# Patient Record
Sex: Female | Born: 1960 | Race: White | Hispanic: No | Marital: Married | Smoking: Never smoker
Health system: Southern US, Community
[De-identification: ages and names within clinical notes are randomized; demographics above are authoritative.]

## PROBLEM LIST (undated history)

## (undated) ENCOUNTER — Ambulatory Visit: Admission: EM

## (undated) DIAGNOSIS — N809 Endometriosis, unspecified: Secondary | ICD-10-CM

## (undated) DIAGNOSIS — N946 Dysmenorrhea, unspecified: Secondary | ICD-10-CM

## (undated) DIAGNOSIS — IMO0002 Reserved for concepts with insufficient information to code with codable children: Secondary | ICD-10-CM

## (undated) DIAGNOSIS — R519 Headache, unspecified: Secondary | ICD-10-CM

## (undated) DIAGNOSIS — R51 Headache: Secondary | ICD-10-CM

## (undated) DIAGNOSIS — M542 Cervicalgia: Secondary | ICD-10-CM

## (undated) DIAGNOSIS — K219 Gastro-esophageal reflux disease without esophagitis: Secondary | ICD-10-CM

## (undated) HISTORY — DX: Endometriosis, unspecified: N80.9

## (undated) HISTORY — DX: Gastro-esophageal reflux disease without esophagitis: K21.9

## (undated) HISTORY — DX: Headache: R51

## (undated) HISTORY — DX: Headache, unspecified: R51.9

## (undated) HISTORY — DX: Cervicalgia: M54.2

## (undated) HISTORY — PX: ABDOMINAL HYSTERECTOMY: SHX81

## (undated) HISTORY — DX: Dysmenorrhea, unspecified: N94.6

## (undated) HISTORY — DX: Reserved for concepts with insufficient information to code with codable children: IMO0002

---

## 2005-10-07 ENCOUNTER — Ambulatory Visit: Payer: Self-pay | Admitting: Internal Medicine

## 2005-10-17 ENCOUNTER — Ambulatory Visit: Payer: Self-pay | Admitting: Internal Medicine

## 2007-10-07 HISTORY — PX: COLONOSCOPY W/ POLYPECTOMY: SHX1380

## 2011-11-19 ENCOUNTER — Ambulatory Visit: Payer: Self-pay | Admitting: Internal Medicine

## 2012-05-26 DIAGNOSIS — IMO0002 Reserved for concepts with insufficient information to code with codable children: Secondary | ICD-10-CM | POA: Insufficient documentation

## 2012-05-26 DIAGNOSIS — R3915 Urgency of urination: Secondary | ICD-10-CM | POA: Insufficient documentation

## 2012-05-26 DIAGNOSIS — R350 Frequency of micturition: Secondary | ICD-10-CM | POA: Insufficient documentation

## 2012-09-01 DIAGNOSIS — N9489 Other specified conditions associated with female genital organs and menstrual cycle: Secondary | ICD-10-CM | POA: Insufficient documentation

## 2012-09-01 DIAGNOSIS — N3010 Interstitial cystitis (chronic) without hematuria: Secondary | ICD-10-CM | POA: Insufficient documentation

## 2012-09-01 DIAGNOSIS — M6289 Other specified disorders of muscle: Secondary | ICD-10-CM | POA: Insufficient documentation

## 2012-09-01 DIAGNOSIS — N94819 Vulvodynia, unspecified: Secondary | ICD-10-CM | POA: Insufficient documentation

## 2012-11-22 ENCOUNTER — Ambulatory Visit: Payer: Self-pay | Admitting: Unknown Physician Specialty

## 2013-03-08 ENCOUNTER — Ambulatory Visit: Payer: Self-pay | Admitting: Internal Medicine

## 2014-03-21 ENCOUNTER — Other Ambulatory Visit: Payer: Self-pay

## 2014-03-21 DIAGNOSIS — Z1231 Encounter for screening mammogram for malignant neoplasm of breast: Secondary | ICD-10-CM

## 2014-04-10 ENCOUNTER — Ambulatory Visit: Payer: Self-pay

## 2014-04-14 ENCOUNTER — Other Ambulatory Visit: Payer: Self-pay

## 2014-04-14 ENCOUNTER — Encounter (INDEPENDENT_AMBULATORY_CARE_PROVIDER_SITE_OTHER): Payer: Self-pay

## 2014-04-14 ENCOUNTER — Ambulatory Visit
Admission: RE | Admit: 2014-04-14 | Discharge: 2014-04-14 | Disposition: A | Discharge status: Home / Self Care | Payer: 59 | Source: Ambulatory Visit

## 2014-04-14 ENCOUNTER — Ambulatory Visit: Payer: Self-pay

## 2014-04-14 DIAGNOSIS — Z1231 Encounter for screening mammogram for malignant neoplasm of breast: Secondary | ICD-10-CM

## 2015-01-08 ENCOUNTER — Emergency Department
Admit: 2015-01-08 | Disposition: A | Discharge status: Home / Self Care | Payer: Self-pay | Admitting: Emergency Medicine

## 2015-01-08 LAB — CBC
HCT: 41.9 % (ref 35.0–47.0)
HGB: 13.9 g/dL (ref 12.0–16.0)
MCH: 32.6 pg (ref 26.0–34.0)
MCHC: 33.2 g/dL (ref 32.0–36.0)
MCV: 98 fL (ref 80–100)
PLATELETS: 230 10*3/uL (ref 150–440)
RBC: 4.26 10*6/uL (ref 3.80–5.20)
RDW: 12.5 % (ref 11.5–14.5)
WBC: 4.6 10*3/uL (ref 3.6–11.0)

## 2015-01-08 LAB — BASIC METABOLIC PANEL
Anion Gap: 7 (ref 7–16)
BUN: 12 mg/dL
CHLORIDE: 101 mmol/L
Calcium, Total: 8.8 mg/dL — ABNORMAL LOW
Co2: 29 mmol/L
Creatinine: 0.72 mg/dL
Glucose: 97 mg/dL
POTASSIUM: 3.1 mmol/L — AB
Sodium: 137 mmol/L

## 2015-01-08 LAB — TROPONIN I: Troponin-I: <0.03 ng/mL

## 2015-03-20 ENCOUNTER — Telehealth: Payer: Self-pay | Admitting: Obstetrics and Gynecology

## 2015-03-20 ENCOUNTER — Other Ambulatory Visit: Payer: Self-pay | Admitting: Obstetrics and Gynecology

## 2015-03-20 DIAGNOSIS — R922 Inconclusive mammogram: Secondary | ICD-10-CM

## 2015-03-20 NOTE — Telephone Encounter (Signed)
Forward from [employer]

## 2015-03-20 NOTE — Telephone Encounter (Signed)
Done, thanks tina

## 2015-03-20 NOTE — Telephone Encounter (Signed)
Order put in and added note to do 3D films

## 2015-03-20 NOTE — Telephone Encounter (Signed)
Pt called and was having her mammos done in [place] at the [facility] but now she would like to go to [facility] since they do the 3D mommo, she was told that you need to put an order in for the 3d mammo so she could have it done, let m eknow when you have done so and I can call the pt and let her know she can call [facility] and set her mammo up. tina .

## 2015-03-29 ENCOUNTER — Other Ambulatory Visit: Payer: Self-pay | Admitting: Obstetrics and Gynecology

## 2015-05-02 ENCOUNTER — Other Ambulatory Visit: Payer: Self-pay | Admitting: Obstetrics and Gynecology

## 2015-05-29 ENCOUNTER — Other Ambulatory Visit: Payer: Self-pay | Admitting: Obstetrics and Gynecology

## 2015-06-12 ENCOUNTER — Encounter: Payer: Self-pay | Admitting: *Deleted

## 2015-06-13 ENCOUNTER — Ambulatory Visit (INDEPENDENT_AMBULATORY_CARE_PROVIDER_SITE_OTHER): Payer: 59 | Admitting: Obstetrics and Gynecology

## 2015-06-13 ENCOUNTER — Encounter: Payer: Self-pay | Admitting: Obstetrics and Gynecology

## 2015-06-13 VITALS — BP 110/69 | HR 74 | Ht 66.0 in | Wt 137.4 lb

## 2015-06-13 DIAGNOSIS — Z79890 Hormone replacement therapy (postmenopausal): Secondary | ICD-10-CM

## 2015-06-13 MED ORDER — ESTRADIOL MICRONIZED POWD: 1.0000 | Freq: Every day | Status: DC

## 2015-06-19 ENCOUNTER — Ambulatory Visit
Admission: RE | Admit: 2015-06-19 | Discharge: 2015-06-19 | Disposition: A | Discharge status: Home / Self Care | Payer: 59 | Source: Ambulatory Visit | Attending: Obstetrics and Gynecology | Admitting: Obstetrics and Gynecology

## 2015-06-19 ENCOUNTER — Other Ambulatory Visit: Payer: Self-pay | Admitting: Obstetrics and Gynecology

## 2015-06-19 ENCOUNTER — Telehealth: Payer: Self-pay | Admitting: *Deleted

## 2015-06-19 DIAGNOSIS — R922 Inconclusive mammogram: Secondary | ICD-10-CM

## 2015-06-19 DIAGNOSIS — Z1231 Encounter for screening mammogram for malignant neoplasm of breast: Secondary | ICD-10-CM | POA: Insufficient documentation

## 2015-06-19 NOTE — Telephone Encounter (Signed)
-----   Message from Evonnie Pat, [place] sent at 06/19/2015  9:55 AM EDT ----- Please let her know her mmg was normal

## 2015-06-19 NOTE — Telephone Encounter (Signed)
Left detailed message about patients mammo

## 2015-08-07 ENCOUNTER — Encounter: Payer: Self-pay | Admitting: Obstetrics and Gynecology

## 2015-08-23 ENCOUNTER — Other Ambulatory Visit: Payer: Self-pay | Admitting: Obstetrics and Gynecology

## 2015-08-23 ENCOUNTER — Encounter: Payer: Self-pay | Admitting: Obstetrics and Gynecology

## 2015-08-23 ENCOUNTER — Ambulatory Visit (INDEPENDENT_AMBULATORY_CARE_PROVIDER_SITE_OTHER): Payer: 59 | Admitting: Obstetrics and Gynecology

## 2015-08-23 VITALS — BP 110/70 | HR 69 | Wt 143.5 lb

## 2015-08-23 DIAGNOSIS — Z01419 Encounter for gynecological examination (general) (routine) without abnormal findings: Secondary | ICD-10-CM

## 2015-08-23 MED ORDER — ALPRAZOLAM 0.25 MG PO TABS: 0.2500 mg | ORAL_TABLET | Freq: Every evening | ORAL | Status: DC | PRN

## 2015-08-23 MED ORDER — EFFEXOR XR 75 MG PO CP24: 75.0000 mg | ORAL_CAPSULE | Freq: Every day | ORAL | Status: DC

## 2015-08-23 MED ORDER — ESTRADIOL MICRONIZED POWD: 1.0000 | Freq: Every day | Status: DC

## 2015-08-23 NOTE — Progress Notes (Signed)
Subjective:   Donna Collins is a 54 y.o. No obstetric history on file. Caucasian female here for a routine well-woman exam.  No LMP recorded. Patient is postmenopausal.    Current complaints: vaginal dryness when backs off estrogen cream PCP: me       Does need labs  Social History: Sexual: heterosexual Marital Status: married Living situation: with spouse Occupation: Freight forwarder Tobacco/alcohol: no tobacco use Illicit drugs: no history of illicit drug use  The following portions of the patient's history were reviewed and updated as appropriate: allergies, current medications, past family history, past medical history, past social history, past surgical history and problem list.  Past Medical History Past Medical History  Diagnosis Date  . Endometriosis   . Painful menstrual periods   . GERD (gastroesophageal reflux disease)   . Headache   . Neck pain   . Painful intercourse     Past Surgical History Past Surgical History  Procedure Laterality Date  . Colonoscopy w/ polypectomy  2009    Gynecologic History No obstetric history on file.  No LMP recorded. Patient is postmenopausal. Contraception: post menopausal status Last Pap: 2014. Results were: normal Last mammogram: 2015. Results were: normal  Obstetric History OB History  No data available    Current Medications Current Outpatient Prescriptions on File Prior to Visit  Medication Sig Dispense Refill  . progesterone (PROMETRIUM) 100 MG capsule Take 100 mg by mouth daily.     No current facility-administered medications on file prior to visit.    Review of Systems Patient denies any headaches, blurred vision, shortness of breath, chest pain, abdominal pain, problems with bowel movements, urination, or intercourse.  Objective:  BP 110/70 mmHg  Pulse 69  Wt 143 lb 8 oz (65.091 kg) Physical Exam  General:  Well developed, well nourished, no acute distress. She is alert and oriented x3. Skin:  Warm  and dry Neck:  Midline trachea, no thyromegaly or nodules Cardiovascular: Regular rate and rhythm, no murmur heard Lungs:  Effort normal, all lung fields clear to auscultation bilaterally Breasts:  No dominant palpable mass, retraction, or nipple discharge Abdomen:  Soft, non tender, no hepatosplenomegaly or masses Pelvic:  External genitalia is normal in appearance.  The vagina is normal in appearance. The cervix is bulbous, no CMT.  Thin prep pap is done with HR HPV cotesting. Uterus is felt to be normal size, shape, and contour.  No adnexal masses or tenderness noted. Extremities:  No swelling or varicosities noted Psych:  She has a normal mood and affect  Assessment:   Healthy well-woman exam; postmenopausal HRT; IC;  Plan:  Pap & labs obtained F/U 1 year for AE, or sooner if needed Mammogram scheduled  Melody Valene Bors, CNM

## 2015-08-23 NOTE — Patient Instructions (Signed)
  Place annual gynecologic exam patient instructions here.  Thank you for enrolling in [facility]. Please follow the instructions below to securely access your online medical record. MyChart allows you to send messages to your doctor, view your test results, manage appointments, and more.   How Do I Sign Up? 1. In your Internet browser, go to [employer] and enter [email or web address]. 2. Click on the Sign Up Now link in the Sign In box. You will see the New Member Sign Up page. 3. Enter your MyChart Access Code exactly as it appears below. You will not need to use this code after you've completed the sign-up process. If you do not sign up before the expiration date, you must request a new code.  MyChart Access Code: [email or web address] Expires: 10/05/2015 11:00 AM  4. Enter your Social Security Number ([id number]) and Date of Birth (mm/dd/yyyy) as indicated and click Submit. You will be taken to the next sign-up page. 5. Create a MyChart ID. This will be your MyChart login ID and cannot be changed, so think of one that is secure and easy to remember. 6. Create a MyChart password. You can change your password at any time. 7. Enter your Password Reset Question and Answer. This can be used at a later time if you forget your password.  8. Enter your e-mail address. You will receive e-mail notification when new information is available in [place]. 9. Click Sign Up. You can now view your medical record.   Additional Information Remember, MyChart is NOT to be used for urgent needs. For medical emergencies, dial 911.

## 2015-08-27 ENCOUNTER — Telehealth: Payer: Self-pay | Admitting: Obstetrics and Gynecology

## 2015-08-27 LAB — COMPREHENSIVE METABOLIC PANEL
ALK PHOS: 33 IU/L — AB (ref 39–117)
ALT: 25 IU/L (ref 0–32)
AST: 34 IU/L (ref 0–40)
Albumin/Globulin Ratio: 2.3 (ref 1.1–2.5)
Albumin: 4.3 g/dL (ref 3.5–5.5)
BUN/Creatinine Ratio: 19 (ref 9–23)
BUN: 14 mg/dL (ref 6–24)
Bilirubin Total: 0.5 mg/dL (ref 0.0–1.2)
CO2: 27 mmol/L (ref 18–29)
CREATININE: 0.75 mg/dL (ref 0.57–1.00)
Calcium: 9.1 mg/dL (ref 8.7–10.2)
Chloride: 101 mmol/L (ref 97–106)
GFR calc Af Amer: 105 mL/min/{1.73_m2} (ref >59)
GFR calc non Af Amer: 91 mL/min/{1.73_m2} (ref >59)
GLOBULIN, TOTAL: 1.9 g/dL (ref 1.5–4.5)
GLUCOSE: 88 mg/dL (ref 65–99)
Potassium: 4 mmol/L (ref 3.5–5.2)
SODIUM: 141 mmol/L (ref 136–144)
Total Protein: 6.2 g/dL (ref 6.0–8.5)

## 2015-08-27 LAB — LIPID PANEL
CHOLESTEROL TOTAL: 159 mg/dL (ref 100–199)
Chol/HDL Ratio: 1.9 ratio units (ref 0.0–4.4)
HDL: 85 mg/dL (ref >39)
LDL CALC: 65 mg/dL (ref 0–99)
Triglycerides: 43 mg/dL (ref 0–149)
VLDL CHOLESTEROL CAL: 9 mg/dL (ref 5–40)

## 2015-08-27 LAB — PROGESTERONE: PROGESTERONE: 1.4 ng/mL

## 2015-08-27 LAB — VITAMIN D 25 HYDROXY (VIT D DEFICIENCY, FRACTURES): Vit D, 25-Hydroxy: 40 ng/mL (ref 30.0–100.0)

## 2015-08-27 LAB — ESTRADIOL: ESTRADIOL: 9.7 pg/mL

## 2015-08-27 NOTE — Telephone Encounter (Signed)
medicap has a question about the RX you send there. Estrdiol?    Please call!

## 2015-08-28 ENCOUNTER — Other Ambulatory Visit: Payer: Self-pay | Admitting: Obstetrics and Gynecology

## 2015-08-30 LAB — PAP IG AND HPV HIGH-RISK
HPV, high-risk: NEGATIVE
PAP Smear Comment: 0

## 2015-09-05 NOTE — Telephone Encounter (Signed)
-----   Message from Joylene Igo, [place] sent at 08/28/2015 10:08 AM EST ----- Please let her know all labs look good, and encourage her to sign up for MyChart as we discussed to be able to review labs and send me messages.

## 2015-09-05 NOTE — Telephone Encounter (Signed)
Notified pt of results and encouraged her to sign up for mychart

## 2015-10-09 ENCOUNTER — Telehealth: Payer: Self-pay | Admitting: Obstetrics and Gynecology

## 2015-10-09 NOTE — Telephone Encounter (Signed)
pls advise

## 2015-10-09 NOTE — Telephone Encounter (Signed)
RX Estridiol compound cream ...... It is running out before end of month. Mel told her to use once at night, skip the last 2 nights of the month. She is running out because the directions are different than what Mel told her to do. Can you change directions to  once at night every night. Call her if this doesn't make sense

## 2015-10-23 NOTE — Telephone Encounter (Signed)
Please let her know it is fixed

## 2015-10-23 NOTE — Telephone Encounter (Signed)
Notified pt rx done

## 2015-10-30 NOTE — Progress Notes (Signed)
Patient ID: Donna Collins, female   DOB: Nov 03, 1960, 55 y.o.   MRN: [id number]  See scanned email- meds adjusted accordingly.  A: HRT  P: medication refills sent in, will recheck at planned AE.  Melody [place], CNM

## 2016-05-14 ENCOUNTER — Other Ambulatory Visit: Payer: Self-pay | Admitting: Internal Medicine

## 2016-05-14 DIAGNOSIS — Z1231 Encounter for screening mammogram for malignant neoplasm of breast: Secondary | ICD-10-CM

## 2016-06-19 ENCOUNTER — Ambulatory Visit
Admission: RE | Admit: 2016-06-19 | Discharge: 2016-06-19 | Disposition: A | Discharge status: Home / Self Care | Payer: 59 | Source: Ambulatory Visit | Attending: Internal Medicine | Admitting: Internal Medicine

## 2016-06-19 DIAGNOSIS — Z1231 Encounter for screening mammogram for malignant neoplasm of breast: Secondary | ICD-10-CM | POA: Insufficient documentation

## 2016-08-06 ENCOUNTER — Telehealth: Payer: Self-pay | Admitting: Obstetrics and Gynecology

## 2016-08-06 NOTE — Telephone Encounter (Signed)
She has an appointment at the end of the month, but have her tell her pharmacy to send my a refill request

## 2016-08-06 NOTE — Telephone Encounter (Signed)
PT CALLED AND SHE IS OUT OF HER ESTRADIAL AND DIDN'T KNOW IF SHE NEEDED TO MAKE AND APPT IN ORDER TO GET A REFILL OR IF YOU COULD REFILL IT WITHOUT HER MAKING AN APPT.

## 2016-08-07 NOTE — Telephone Encounter (Signed)
Called pt and reminded hr of appt on 11/21 and for her to call her pharmacy and send a refill request in for her med.

## 2016-08-26 ENCOUNTER — Ambulatory Visit (INDEPENDENT_AMBULATORY_CARE_PROVIDER_SITE_OTHER): Payer: 59 | Admitting: Obstetrics and Gynecology

## 2016-08-26 ENCOUNTER — Encounter: Payer: Self-pay | Admitting: Obstetrics and Gynecology

## 2016-08-26 VITALS — BP 122/77 | HR 69 | Ht 66.0 in | Wt 150.7 lb

## 2016-08-26 DIAGNOSIS — Z01419 Encounter for gynecological examination (general) (routine) without abnormal findings: Secondary | ICD-10-CM

## 2016-08-26 NOTE — Progress Notes (Signed)
Subjective:   Donna Collins is a 55 y.o. No obstetric history on file. Caucasian female here for a routine well-woman exam.  No LMP recorded. Patient is postmenopausal.   Reports doing well on Estrogen vaginal cream. Current complaints: none PCP: [facility]       Does desire labs Reports increased stress at work, states she has had some breast tenderness from this. Reports she has been working out less and eating and drinking more and knows she needs to improve that. Brother dx with diabetes this year, no other change in family history. Had mammogram 06/2016 Declines Flu vaccine Declines TDAP   Social History: Sexual: heterosexual Marital Status: married Living situation: with spouse Occupation: works at [employer]: 1-2 glasses of wine, caffeine use daily Illicit drugs: no history of illicit drug use  The following portions of the patient's history were reviewed and updated as appropriate: allergies, current medications, past family history, past medical history, past social history, past surgical history and problem list.  Past Medical History Past Medical History:  Diagnosis Date  . Endometriosis   . GERD (gastroesophageal reflux disease)   . Headache   . Neck pain   . Painful intercourse   . Painful menstrual periods     Past Surgical History Past Surgical History:  Procedure Laterality Date  . COLONOSCOPY W/ POLYPECTOMY  2009    Gynecologic History No obstetric history on file.  No LMP recorded. Patient is postmenopausal. Contraception: post menopausal status Last Pap: 2016. Results were: normal Last mammogram: 2017. Results were: normal  Obstetric History OB History  No data available    Current Medications Current Outpatient Prescriptions on File Prior to Visit  Medication Sig Dispense Refill  . ALPRAZolam (XANAX) 0.25 MG tablet Take 1 tablet (0.25 mg total) by mouth at bedtime as needed for anxiety. 30 tablet 1  . EFFEXOR XR 75 MG 24 hr capsule  Take 1 capsule (75 mg total) by mouth daily with breakfast. 90 capsule 2  . Estradiol Micronized POWD 1 Applicatorful by Does not apply route at bedtime. 30 g 10  . progesterone (PROMETRIUM) 100 MG capsule Take 100 mg by mouth daily.     No current facility-administered medications on file prior to visit.     Review of Systems Patient denies any headaches, blurred vision, shortness of breath, chest pain, abdominal pain, problems with bowel movements, urination, or intercourse.  Objective:  BP 122/77   Pulse 69   Ht 5\' 6"  (1.676 m)   Wt 150 lb 11.2 oz (68.4 kg)   BMI 24.32 kg/m  Physical Exam  General:  Well developed, well nourished, no acute distress. She is alert and oriented x3. Skin:  Warm and dry Neck:  Midline trachea, no thyromegaly or nodules Cardiovascular: Regular rate and rhythm, no murmur heard Lungs:  Effort normal, all lung fields clear to auscultation bilaterally Breasts:  No dominant palpable mass, retraction, or nipple discharge Abdomen:  Soft, non tender, no hepatosplenomegaly or masses Pelvic:  External genitalia is normal in appearance.  The vagina is normal in appearance. The cervix is bulbous, no CMT.   Uterus is felt to be normal size, shape, and contour.  No adnexal masses or tenderness noted. Extremities:  No swelling or varicosities noted Psych:  She has a normal mood and affect  Assessment:   Healthy well-woman exam S/P Menopausal doing well on ERT PAP not indicated this year  Plan:  Routine labs ordered F/U in 1 year for AE, or sooner if needed  Mammogram 06/2017 or sooner if problems Colonoscopy followed by PCP  Thea Gist, RN, Student FNP  Melody Rockney Ghee, CNM

## 2016-08-26 NOTE — Patient Instructions (Signed)
  Place annual gynecologic exam patient instructions here.  Thank you for enrolling in [facility]. Please follow the instructions below to securely access your online medical record. MyChart allows you to send messages to your doctor, view your test results, manage appointments, and more.   How Do I Sign Up? 1. In your Internet browser, go to [employer] and enter [email or web address]. 2. Click on the Sign Up Now link in the Sign In box. You will see the New Member Sign Up page. 3. Enter your MyChart Access Code exactly as it appears below. You will not need to use this code after you've completed the sign-up process. If you do not sign up before the expiration date, you must request a new code.  MyChart Access Code: [email or web address] Expires: 10/25/2016  8:02 AM  4. Enter your Social Security Number ([id number]) and Date of Birth (mm/dd/yyyy) as indicated and click Submit. You will be taken to the next sign-up page. 5. Create a MyChart ID. This will be your MyChart login ID and cannot be changed, so think of one that is secure and easy to remember. 6. Create a MyChart password. You can change your password at any time. 7. Enter your Password Reset Question and Answer. This can be used at a later time if you forget your password.  8. Enter your e-mail address. You will receive e-mail notification when new information is available in [place]. 9. Click Sign Up. You can now view your medical record.   Additional Information Remember, MyChart is NOT to be used for urgent needs. For medical emergencies, dial 911.

## 2016-08-27 LAB — COMPREHENSIVE METABOLIC PANEL
A/G RATIO: 1.7 (ref 1.2–2.2)
ALT: 13 IU/L (ref 0–32)
AST: 19 IU/L (ref 0–40)
Albumin: 4.1 g/dL (ref 3.5–5.5)
Alkaline Phosphatase: 32 IU/L — ABNORMAL LOW (ref 39–117)
BILIRUBIN TOTAL: 0.6 mg/dL (ref 0.0–1.2)
BUN/Creatinine Ratio: 19 (ref 9–23)
BUN: 15 mg/dL (ref 6–24)
CHLORIDE: 103 mmol/L (ref 96–106)
CO2: 26 mmol/L (ref 18–29)
Calcium: 8.8 mg/dL (ref 8.7–10.2)
Creatinine, Ser: 0.79 mg/dL (ref 0.57–1.00)
GFR calc non Af Amer: 85 mL/min/{1.73_m2} (ref >59)
GFR, EST AFRICAN AMERICAN: 98 mL/min/{1.73_m2} (ref >59)
GLOBULIN, TOTAL: 2.4 g/dL (ref 1.5–4.5)
Glucose: 96 mg/dL (ref 65–99)
POTASSIUM: 4.8 mmol/L (ref 3.5–5.2)
SODIUM: 141 mmol/L (ref 134–144)
TOTAL PROTEIN: 6.5 g/dL (ref 6.0–8.5)

## 2016-08-27 LAB — LIPID PANEL
CHOL/HDL RATIO: 2 ratio (ref 0.0–4.4)
Cholesterol, Total: 159 mg/dL (ref 100–199)
HDL: 78 mg/dL (ref >39)
LDL Calculated: 73 mg/dL (ref 0–99)
Triglycerides: 38 mg/dL (ref 0–149)
VLDL Cholesterol Cal: 8 mg/dL (ref 5–40)

## 2016-08-27 LAB — CBC
HEMOGLOBIN: 14.1 g/dL (ref 11.1–15.9)
Hematocrit: 40.8 % (ref 34.0–46.6)
MCH: 33.4 pg — AB (ref 26.6–33.0)
MCHC: 34.6 g/dL (ref 31.5–35.7)
MCV: 97 fL (ref 79–97)
PLATELETS: 256 10*3/uL (ref 150–379)
RBC: 4.22 x10E6/uL (ref 3.77–5.28)
RDW: 12.5 % (ref 12.3–15.4)
WBC: 4.7 10*3/uL (ref 3.4–10.8)

## 2016-08-27 LAB — VITAMIN D 25 HYDROXY (VIT D DEFICIENCY, FRACTURES): Vit D, 25-Hydroxy: 44.6 ng/mL (ref 30.0–100.0)

## 2016-08-27 LAB — HEMOGLOBIN A1C
Est. average glucose Bld gHb Est-mCnc: 114 mg/dL
HEMOGLOBIN A1C: 5.6 % (ref 4.8–5.6)

## 2016-11-20 DIAGNOSIS — L728 Other follicular cysts of the skin and subcutaneous tissue: Secondary | ICD-10-CM | POA: Diagnosis not present

## 2016-11-21 DIAGNOSIS — M531 Cervicobrachial syndrome: Secondary | ICD-10-CM | POA: Diagnosis not present

## 2016-11-21 DIAGNOSIS — M542 Cervicalgia: Secondary | ICD-10-CM | POA: Diagnosis not present

## 2016-11-21 DIAGNOSIS — M9901 Segmental and somatic dysfunction of cervical region: Secondary | ICD-10-CM | POA: Diagnosis not present

## 2016-11-25 DIAGNOSIS — M531 Cervicobrachial syndrome: Secondary | ICD-10-CM | POA: Diagnosis not present

## 2016-11-25 DIAGNOSIS — M9901 Segmental and somatic dysfunction of cervical region: Secondary | ICD-10-CM | POA: Diagnosis not present

## 2016-11-25 DIAGNOSIS — M542 Cervicalgia: Secondary | ICD-10-CM | POA: Diagnosis not present

## 2016-12-05 DIAGNOSIS — M542 Cervicalgia: Secondary | ICD-10-CM | POA: Diagnosis not present

## 2016-12-05 DIAGNOSIS — M9901 Segmental and somatic dysfunction of cervical region: Secondary | ICD-10-CM | POA: Diagnosis not present

## 2016-12-05 DIAGNOSIS — M531 Cervicobrachial syndrome: Secondary | ICD-10-CM | POA: Diagnosis not present

## 2016-12-09 DIAGNOSIS — M531 Cervicobrachial syndrome: Secondary | ICD-10-CM | POA: Diagnosis not present

## 2016-12-09 DIAGNOSIS — M542 Cervicalgia: Secondary | ICD-10-CM | POA: Diagnosis not present

## 2016-12-09 DIAGNOSIS — M9901 Segmental and somatic dysfunction of cervical region: Secondary | ICD-10-CM | POA: Diagnosis not present

## 2016-12-12 DIAGNOSIS — M542 Cervicalgia: Secondary | ICD-10-CM | POA: Diagnosis not present

## 2016-12-12 DIAGNOSIS — M531 Cervicobrachial syndrome: Secondary | ICD-10-CM | POA: Diagnosis not present

## 2016-12-12 DIAGNOSIS — M9901 Segmental and somatic dysfunction of cervical region: Secondary | ICD-10-CM | POA: Diagnosis not present

## 2016-12-15 DIAGNOSIS — M542 Cervicalgia: Secondary | ICD-10-CM | POA: Diagnosis not present

## 2016-12-15 DIAGNOSIS — M531 Cervicobrachial syndrome: Secondary | ICD-10-CM | POA: Diagnosis not present

## 2016-12-15 DIAGNOSIS — M9901 Segmental and somatic dysfunction of cervical region: Secondary | ICD-10-CM | POA: Diagnosis not present

## 2016-12-18 DIAGNOSIS — M9901 Segmental and somatic dysfunction of cervical region: Secondary | ICD-10-CM | POA: Diagnosis not present

## 2016-12-18 DIAGNOSIS — M531 Cervicobrachial syndrome: Secondary | ICD-10-CM | POA: Diagnosis not present

## 2016-12-18 DIAGNOSIS — M542 Cervicalgia: Secondary | ICD-10-CM | POA: Diagnosis not present

## 2016-12-22 DIAGNOSIS — M531 Cervicobrachial syndrome: Secondary | ICD-10-CM | POA: Diagnosis not present

## 2016-12-22 DIAGNOSIS — M542 Cervicalgia: Secondary | ICD-10-CM | POA: Diagnosis not present

## 2016-12-22 DIAGNOSIS — M9901 Segmental and somatic dysfunction of cervical region: Secondary | ICD-10-CM | POA: Diagnosis not present

## 2016-12-29 DIAGNOSIS — M531 Cervicobrachial syndrome: Secondary | ICD-10-CM | POA: Diagnosis not present

## 2016-12-29 DIAGNOSIS — M9901 Segmental and somatic dysfunction of cervical region: Secondary | ICD-10-CM | POA: Diagnosis not present

## 2016-12-29 DIAGNOSIS — M542 Cervicalgia: Secondary | ICD-10-CM | POA: Diagnosis not present

## 2017-03-17 ENCOUNTER — Telehealth: Payer: Self-pay | Admitting: Obstetrics and Gynecology

## 2017-03-17 NOTE — Telephone Encounter (Signed)
Patient called in to let Melody and Amy know that she needs a refill on the medication Estradiol Micronized POWD (Vaginal Cream) The patient would also like a call to verify a few things as well. Please advise.

## 2017-03-17 NOTE — Telephone Encounter (Signed)
Called pt and rx was sent

## 2017-03-17 NOTE — Telephone Encounter (Signed)
Pls advise.  

## 2017-03-17 NOTE — Telephone Encounter (Signed)
Please ask her to request pharmacy send a refill request, as her prescription is very detailed. Also Ok to ask her about questions.

## 2017-05-08 DIAGNOSIS — K589 Irritable bowel syndrome without diarrhea: Secondary | ICD-10-CM | POA: Diagnosis not present

## 2017-05-08 DIAGNOSIS — Z13220 Encounter for screening for lipoid disorders: Secondary | ICD-10-CM | POA: Diagnosis not present

## 2017-05-08 DIAGNOSIS — Z79899 Other long term (current) drug therapy: Secondary | ICD-10-CM | POA: Diagnosis not present

## 2017-05-08 DIAGNOSIS — Z131 Encounter for screening for diabetes mellitus: Secondary | ICD-10-CM | POA: Diagnosis not present

## 2017-05-08 DIAGNOSIS — Z0000 Encounter for general adult medical examination without abnormal findings: Secondary | ICD-10-CM | POA: Diagnosis not present

## 2017-05-11 DIAGNOSIS — R194 Change in bowel habit: Secondary | ICD-10-CM | POA: Diagnosis not present

## 2017-05-12 ENCOUNTER — Other Ambulatory Visit: Payer: Self-pay | Admitting: Internal Medicine

## 2017-05-12 DIAGNOSIS — R1084 Generalized abdominal pain: Secondary | ICD-10-CM

## 2017-06-16 DIAGNOSIS — Z86010 Personal history of colonic polyps: Secondary | ICD-10-CM | POA: Diagnosis not present

## 2017-06-16 DIAGNOSIS — R194 Change in bowel habit: Secondary | ICD-10-CM | POA: Diagnosis not present

## 2017-06-17 DIAGNOSIS — R194 Change in bowel habit: Secondary | ICD-10-CM | POA: Insufficient documentation

## 2017-06-18 ENCOUNTER — Other Ambulatory Visit
Admission: RE | Admit: 2017-06-18 | Discharge: 2017-06-18 | Disposition: A | Discharge status: Home / Self Care | Payer: 59 | Source: Ambulatory Visit | Attending: Nurse Practitioner | Admitting: Nurse Practitioner

## 2017-06-18 DIAGNOSIS — R194 Change in bowel habit: Secondary | ICD-10-CM | POA: Insufficient documentation

## 2017-06-18 LAB — GASTROINTESTINAL PANEL BY PCR, STOOL (REPLACES STOOL CULTURE)

## 2017-08-03 ENCOUNTER — Other Ambulatory Visit: Payer: Self-pay | Admitting: Internal Medicine

## 2017-08-03 DIAGNOSIS — Z1231 Encounter for screening mammogram for malignant neoplasm of breast: Secondary | ICD-10-CM

## 2017-08-06 ENCOUNTER — Ambulatory Visit
Admission: RE | Admit: 2017-08-06 | Discharge: 2017-08-06 | Disposition: A | Discharge status: Home / Self Care | Payer: 59 | Source: Ambulatory Visit | Attending: Internal Medicine | Admitting: Internal Medicine

## 2017-08-06 DIAGNOSIS — Z1231 Encounter for screening mammogram for malignant neoplasm of breast: Secondary | ICD-10-CM | POA: Insufficient documentation

## 2017-08-10 DIAGNOSIS — M542 Cervicalgia: Secondary | ICD-10-CM | POA: Diagnosis not present

## 2017-08-10 DIAGNOSIS — S161XXA Strain of muscle, fascia and tendon at neck level, initial encounter: Secondary | ICD-10-CM | POA: Diagnosis not present

## 2017-08-10 DIAGNOSIS — F419 Anxiety disorder, unspecified: Secondary | ICD-10-CM | POA: Insufficient documentation

## 2017-09-02 ENCOUNTER — Encounter: Payer: 59 | Admitting: Obstetrics and Gynecology

## 2017-10-07 ENCOUNTER — Other Ambulatory Visit: Payer: Self-pay | Admitting: *Deleted

## 2017-10-07 ENCOUNTER — Telehealth: Payer: Self-pay | Admitting: Obstetrics and Gynecology

## 2017-10-07 MED ORDER — ESTRADIOL MICRONIZED POWD: 1.0000 | Freq: Every day | 10 refills | Status: DC

## 2017-10-07 NOTE — Telephone Encounter (Signed)
Done-ac 

## 2017-10-07 NOTE — Telephone Encounter (Signed)
Patient called stating she needs a refill on estradiol compound sent to  [facility] on [address]. She is scheduled for her annual on 1/17. Thanks

## 2017-10-22 ENCOUNTER — Encounter: Payer: 59 | Admitting: Obstetrics and Gynecology

## 2017-11-19 DIAGNOSIS — D225 Melanocytic nevi of trunk: Secondary | ICD-10-CM | POA: Diagnosis not present

## 2017-11-25 ENCOUNTER — Encounter: Payer: 59 | Admitting: Obstetrics and Gynecology

## 2017-12-15 ENCOUNTER — Encounter: Payer: Self-pay | Admitting: Obstetrics and Gynecology

## 2017-12-15 ENCOUNTER — Ambulatory Visit (INDEPENDENT_AMBULATORY_CARE_PROVIDER_SITE_OTHER): Payer: 59 | Admitting: Obstetrics and Gynecology

## 2017-12-15 VITALS — BP 118/73 | HR 73 | Ht 66.0 in | Wt 135.9 lb

## 2017-12-15 DIAGNOSIS — Z01419 Encounter for gynecological examination (general) (routine) without abnormal findings: Secondary | ICD-10-CM

## 2017-12-15 MED ORDER — ESTRADIOL MICRONIZED POWD: 1.0000 | Freq: Every day | 10 refills | Status: DC

## 2017-12-15 NOTE — Progress Notes (Signed)
Subjective:   Donna Collins is a 57 y.o. No obstetric history on file. Caucasian female here for a routine well-woman exam.  No LMP recorded. Patient is postmenopausal.    Current complaints: stopped effexor last year and doing well. Still takes xanax at bedtime. Stopped drinking an occasional glass of wine. Under a lot of stress with increased pelvic pain. 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 Tobacco/alcohol: no tobacco use Illicit drugs: no history of illicit drug use  The following portions of the patient's history were reviewed and updated as appropriate: allergies, current medications, past family history, past medical history, past social history, past surgical history and problem list.  Past Medical History Past Medical History:  Diagnosis Date  . Endometriosis   . GERD (gastroesophageal reflux disease)   . Headache   . Neck pain   . Painful intercourse   . Painful menstrual periods     Past Surgical History Past Surgical History:  Procedure Laterality Date  . COLONOSCOPY W/ POLYPECTOMY  2009    Gynecologic History No obstetric history on file.  No LMP recorded. Patient is postmenopausal. Contraception: post menopausal status Last Pap: 2016. Results were: normal Last mammogram: 2018. Results were: normal   Obstetric History OB History  No data available    Current Medications Current Outpatient Medications on File Prior to Visit  Medication Sig Dispense Refill  . ALPRAZolam (XANAX) 0.25 MG tablet Take 1 tablet (0.25 mg total) by mouth at bedtime as needed for anxiety. 30 tablet 1  . Estradiol Micronized POWD 1 Applicatorful by Does not apply route at bedtime. 30 g 10  . progesterone (PROMETRIUM) 100 MG capsule Take 100 mg by mouth daily.     No current facility-administered medications on file  prior to visit.     Review of Systems Patient denies any headaches, blurred vision, shortness of breath, chest pain, abdominal pain, problems with bowel movements, urination, or intercourse.  Objective:  BP 118/73   Pulse 73   Ht 5\' 6"  (1.676 m)   Wt 135 lb 14.4 oz (61.6 kg)   BMI 21.93 kg/m  Physical Exam  General:  Well developed, well nourished, no acute distress. She is alert and oriented x3. Skin:  Warm and dry Neck:  Midline trachea, no thyromegaly or nodules Cardiovascular: Regular rate and rhythm, no murmur heard Lungs:  Effort normal, all lung fields clear to auscultation bilaterally Breasts:  No dominant palpable mass, retraction, or nipple discharge Abdomen:  Soft, non tender, no hepatosplenomegaly or masses Pelvic:  External genitalia is normal in appearance.  The vagina is normal in appearance. The cervix is bulbous, no CMT.  Thin prep pap is not done . Uterus is felt to be normal size, shape, and contour.  No adnexal masses or tenderness noted. Extremities:  No swelling or varicosities noted Psych:  She has a normal mood and affect  Assessment:   Healthy well-woman exam ERT interstitual cystitis  Plan:  Labs obtained & will follow up accordingly F/U 1 year for AE, or sooner if needed Mammogram per PCP Colonoscopy due  Melody Rockney Ghee, CNM

## 2017-12-16 LAB — COMPREHENSIVE METABOLIC PANEL
ALBUMIN: 4.2 g/dL (ref 3.5–5.5)
ALK PHOS: 32 IU/L — AB (ref 39–117)
ALT: 15 IU/L (ref 0–32)
AST: 16 IU/L (ref 0–40)
Albumin/Globulin Ratio: 2 (ref 1.2–2.2)
BUN / CREAT RATIO: 18 (ref 9–23)
BUN: 12 mg/dL (ref 6–24)
Bilirubin Total: 0.5 mg/dL (ref 0.0–1.2)
CALCIUM: 9 mg/dL (ref 8.7–10.2)
CO2: 24 mmol/L (ref 20–29)
CREATININE: 0.68 mg/dL (ref 0.57–1.00)
Chloride: 104 mmol/L (ref 96–106)
GFR calc non Af Amer: 99 mL/min/{1.73_m2} (ref >59)
GFR, EST AFRICAN AMERICAN: 114 mL/min/{1.73_m2} (ref >59)
GLUCOSE: 84 mg/dL (ref 65–99)
Globulin, Total: 2.1 g/dL (ref 1.5–4.5)
Potassium: 4.8 mmol/L (ref 3.5–5.2)
Sodium: 141 mmol/L (ref 134–144)
TOTAL PROTEIN: 6.3 g/dL (ref 6.0–8.5)

## 2017-12-16 LAB — LIPID PANEL
Chol/HDL Ratio: 1.9 ratio (ref 0.0–4.4)
Cholesterol, Total: 154 mg/dL (ref 100–199)
HDL: 81 mg/dL (ref >39)
LDL CALC: 62 mg/dL (ref 0–99)
Triglycerides: 57 mg/dL (ref 0–149)
VLDL CHOLESTEROL CAL: 11 mg/dL (ref 5–40)

## 2017-12-16 LAB — THYROID PANEL WITH TSH
FREE THYROXINE INDEX: 1.8 (ref 1.2–4.9)
T3 Uptake Ratio: 23 % — ABNORMAL LOW (ref 24–39)
T4, Total: 7.8 ug/dL (ref 4.5–12.0)
TSH: 1.56 u[IU]/mL (ref 0.450–4.500)

## 2017-12-16 LAB — PROGESTERONE: PROGESTERONE: 0.1 ng/mL

## 2017-12-16 LAB — TESTOSTERONE, FREE, TOTAL, SHBG
SEX HORMONE BINDING: 164.3 nmol/L — AB (ref 17.3–125.0)
Testosterone, Free: 0.2 pg/mL (ref 0.0–4.2)
Testosterone: <3 ng/dL — ABNORMAL LOW (ref 3–41)

## 2017-12-16 LAB — VITAMIN D 25 HYDROXY (VIT D DEFICIENCY, FRACTURES): VIT D 25 HYDROXY: 44.6 ng/mL (ref 30.0–100.0)

## 2017-12-16 LAB — ESTRADIOL: ESTRADIOL: 192.9 pg/mL

## 2017-12-16 LAB — DHEA-SULFATE: DHEA-SO4: 57.8 ug/dL (ref 29.4–220.5)

## 2017-12-17 ENCOUNTER — Telehealth: Payer: Self-pay | Admitting: *Deleted

## 2017-12-17 NOTE — Telephone Encounter (Signed)
Mailed all lab results to pt

## 2017-12-17 NOTE — Telephone Encounter (Signed)
-----   Message from Joylene Igo, [place] sent at 12/17/2017 10:49 AM EDT ----- Please send copy of labs, so she can share with holistic provider.

## 2017-12-22 DIAGNOSIS — R6889 Other general symptoms and signs: Secondary | ICD-10-CM | POA: Diagnosis not present

## 2017-12-22 DIAGNOSIS — R109 Unspecified abdominal pain: Secondary | ICD-10-CM | POA: Diagnosis not present

## 2017-12-22 DIAGNOSIS — R399 Unspecified symptoms and signs involving the genitourinary system: Secondary | ICD-10-CM | POA: Diagnosis not present

## 2017-12-28 ENCOUNTER — Encounter: Payer: Self-pay | Admitting: Obstetrics and Gynecology

## 2017-12-30 ENCOUNTER — Telehealth: Payer: Self-pay | Admitting: Obstetrics and Gynecology

## 2017-12-30 ENCOUNTER — Encounter: Payer: Self-pay | Admitting: Obstetrics and Gynecology

## 2017-12-30 NOTE — Telephone Encounter (Signed)
Patient has questions concerning her lab results. She stated she already sent a mychart message and has not heard anything back. She stated she would like a call back before Tomorrow. Thanks

## 2018-01-01 NOTE — Telephone Encounter (Signed)
Spoke with pt

## 2018-01-26 DIAGNOSIS — K580 Irritable bowel syndrome with diarrhea: Secondary | ICD-10-CM | POA: Diagnosis not present

## 2018-01-26 DIAGNOSIS — R110 Nausea: Secondary | ICD-10-CM | POA: Diagnosis not present

## 2018-01-26 DIAGNOSIS — R194 Change in bowel habit: Secondary | ICD-10-CM | POA: Diagnosis not present

## 2018-03-03 DIAGNOSIS — Z86010 Personal history of colonic polyps: Secondary | ICD-10-CM | POA: Diagnosis not present

## 2018-03-03 DIAGNOSIS — R194 Change in bowel habit: Secondary | ICD-10-CM | POA: Diagnosis not present

## 2018-03-23 DIAGNOSIS — M2550 Pain in unspecified joint: Secondary | ICD-10-CM | POA: Diagnosis not present

## 2018-03-23 DIAGNOSIS — T148XXA Other injury of unspecified body region, initial encounter: Secondary | ICD-10-CM | POA: Diagnosis not present

## 2018-05-20 DIAGNOSIS — Z79899 Other long term (current) drug therapy: Secondary | ICD-10-CM | POA: Diagnosis not present

## 2018-05-20 DIAGNOSIS — Z0000 Encounter for general adult medical examination without abnormal findings: Secondary | ICD-10-CM | POA: Diagnosis not present

## 2018-06-02 DIAGNOSIS — Z13820 Encounter for screening for osteoporosis: Secondary | ICD-10-CM | POA: Diagnosis not present

## 2018-06-10 DIAGNOSIS — H5713 Ocular pain, bilateral: Secondary | ICD-10-CM | POA: Diagnosis not present

## 2018-06-11 DIAGNOSIS — B079 Viral wart, unspecified: Secondary | ICD-10-CM | POA: Diagnosis not present

## 2018-06-11 DIAGNOSIS — D485 Neoplasm of uncertain behavior of skin: Secondary | ICD-10-CM | POA: Diagnosis not present

## 2018-06-24 ENCOUNTER — Other Ambulatory Visit: Payer: Self-pay | Admitting: Internal Medicine

## 2018-06-24 DIAGNOSIS — Z1231 Encounter for screening mammogram for malignant neoplasm of breast: Secondary | ICD-10-CM

## 2018-07-15 DIAGNOSIS — K640 First degree hemorrhoids: Secondary | ICD-10-CM | POA: Diagnosis not present

## 2018-07-15 DIAGNOSIS — Z86010 Personal history of colonic polyps: Secondary | ICD-10-CM | POA: Diagnosis not present

## 2018-08-30 ENCOUNTER — Ambulatory Visit
Admission: RE | Admit: 2018-08-30 | Discharge: 2018-08-30 | Disposition: A | Discharge status: Home / Self Care | Payer: 59 | Source: Ambulatory Visit | Attending: Internal Medicine | Admitting: Internal Medicine

## 2018-08-30 DIAGNOSIS — Z1231 Encounter for screening mammogram for malignant neoplasm of breast: Secondary | ICD-10-CM | POA: Diagnosis not present

## 2018-08-31 ENCOUNTER — Other Ambulatory Visit: Payer: Self-pay | Admitting: *Deleted

## 2018-08-31 ENCOUNTER — Telehealth: Payer: Self-pay | Admitting: Obstetrics and Gynecology

## 2018-08-31 MED ORDER — ESTRADIOL MICRONIZED POWD: 1.0000 | Freq: Every day | 10 refills | Status: AC

## 2018-08-31 NOTE — Telephone Encounter (Signed)
The patient states her Estradiol dosage is not right, and she is running out because it changed from q.d. To q.o.d. And she needs the refill to say q.d.  She is using a little bit less but doing it every night b/c that is what tested as best for her.  Out of refills as well, so when it is sent over to the pharmacy, she is needing refills as well.  Please update and send to pharmacy, at [facility] in [facility] please.  Their number is [phone].  Thanks

## 2018-08-31 NOTE — Telephone Encounter (Signed)
Done-ac 

## 2018-11-16 DIAGNOSIS — I10 Essential (primary) hypertension: Secondary | ICD-10-CM | POA: Insufficient documentation

## 2018-11-17 ENCOUNTER — Other Ambulatory Visit: Payer: Self-pay | Admitting: Internal Medicine

## 2018-11-17 DIAGNOSIS — I10 Essential (primary) hypertension: Secondary | ICD-10-CM

## 2018-12-03 ENCOUNTER — Ambulatory Visit
Admission: RE | Admit: 2018-12-03 | Discharge: 2018-12-03 | Disposition: A | Discharge status: Home / Self Care | Payer: BLUE CROSS/BLUE SHIELD | Source: Ambulatory Visit | Attending: Internal Medicine | Admitting: Internal Medicine

## 2018-12-03 DIAGNOSIS — I10 Essential (primary) hypertension: Secondary | ICD-10-CM | POA: Insufficient documentation

## 2018-12-17 ENCOUNTER — Encounter: Payer: 59 | Admitting: Obstetrics and Gynecology

## 2018-12-21 ENCOUNTER — Encounter: Payer: BLUE CROSS/BLUE SHIELD | Admitting: Obstetrics and Gynecology

## 2019-01-17 ENCOUNTER — Encounter: Payer: Self-pay | Admitting: *Deleted

## 2019-01-25 ENCOUNTER — Encounter: Payer: BLUE CROSS/BLUE SHIELD | Admitting: Obstetrics and Gynecology

## 2019-03-24 ENCOUNTER — Encounter: Payer: Self-pay | Admitting: Obstetrics and Gynecology

## 2019-03-24 ENCOUNTER — Ambulatory Visit (INDEPENDENT_AMBULATORY_CARE_PROVIDER_SITE_OTHER): Payer: BC Managed Care – PPO | Admitting: Obstetrics and Gynecology

## 2019-03-24 ENCOUNTER — Other Ambulatory Visit: Payer: Self-pay

## 2019-03-24 VITALS — BP 131/84 | HR 77 | Ht 66.0 in | Wt 139.7 lb

## 2019-03-24 DIAGNOSIS — Z01419 Encounter for gynecological examination (general) (routine) without abnormal findings: Secondary | ICD-10-CM

## 2019-03-24 DIAGNOSIS — N941 Unspecified dyspareunia: Secondary | ICD-10-CM

## 2019-03-24 IMAGING — MG DIGITAL SCREENING BILATERAL MAMMOGRAM WITH TOMO AND CAD
8 series · 9 of 24 positions shown · non-contrast
Comparison: Previous exam(s).

CLINICAL DATA: Screening.

EXAM:
DIGITAL SCREENING BILATERAL MAMMOGRAM WITH TOMO AND CAD

[L CC synth-2D]
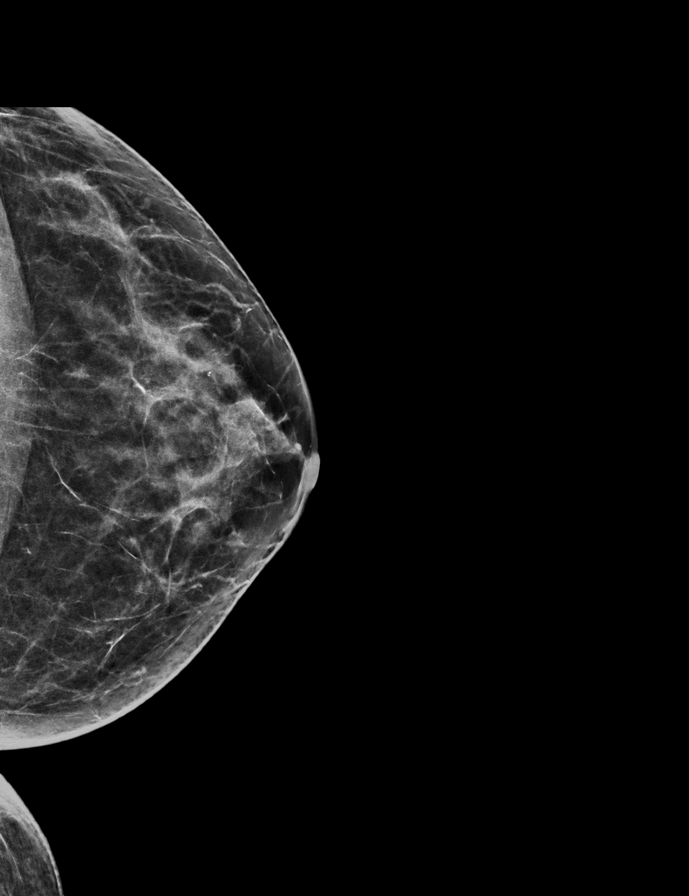

[R CC synth-2D]
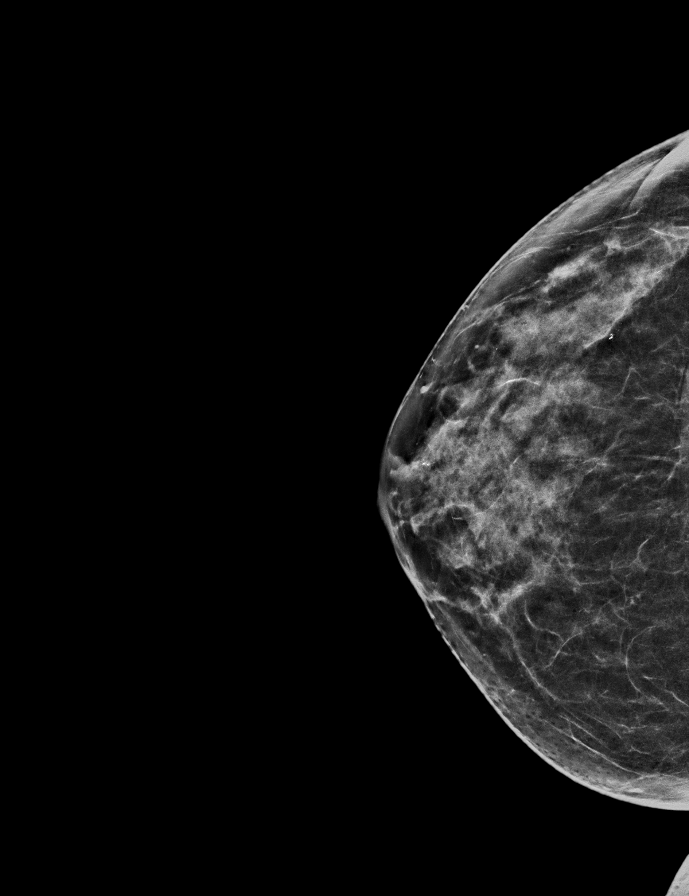

[L MLO synth-2D]
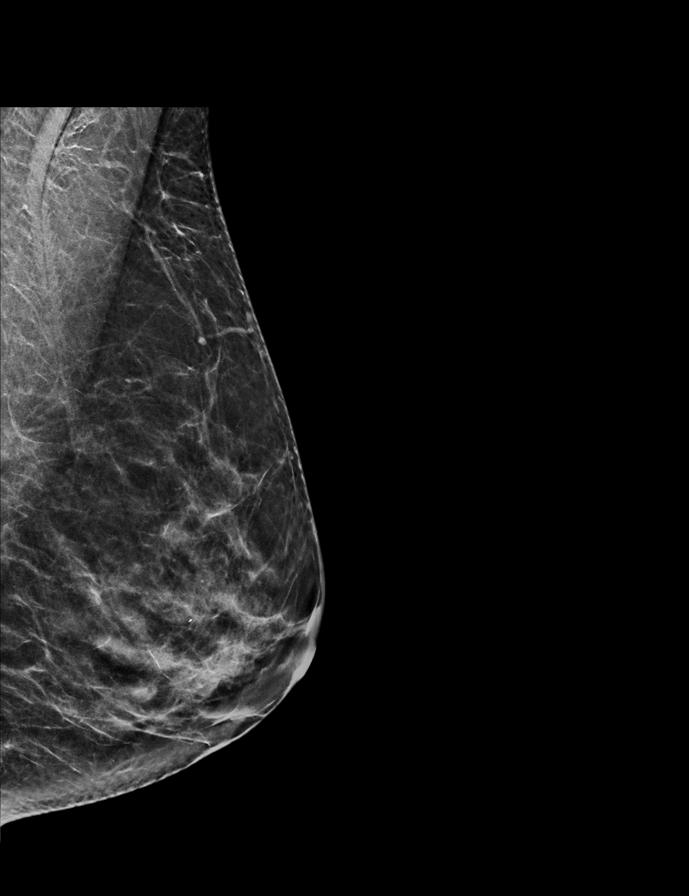

[R MLO synth-2D]
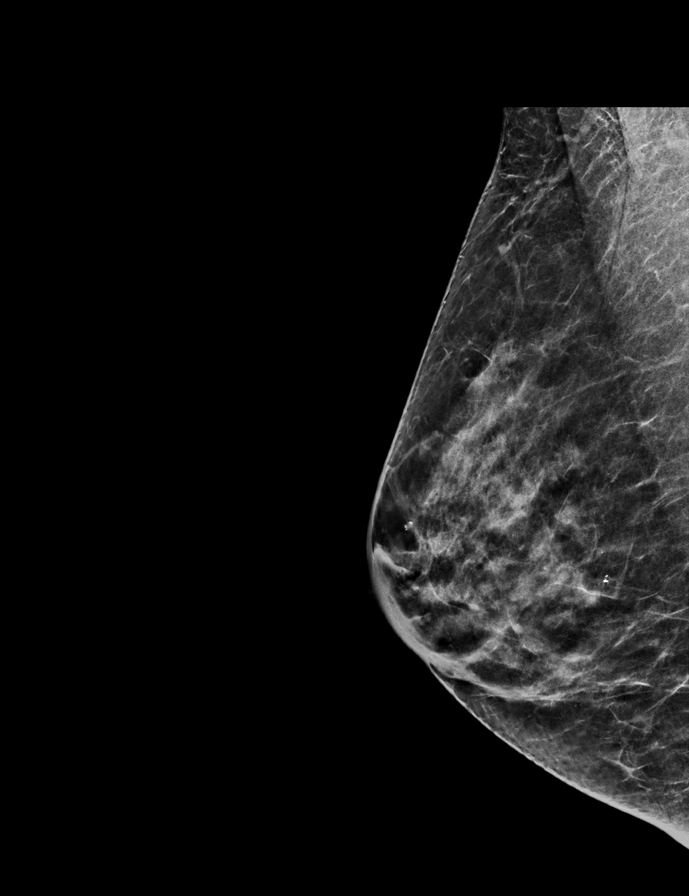

[L MLO tomo · 2 of 52 frames shown]
[frame 17/52]
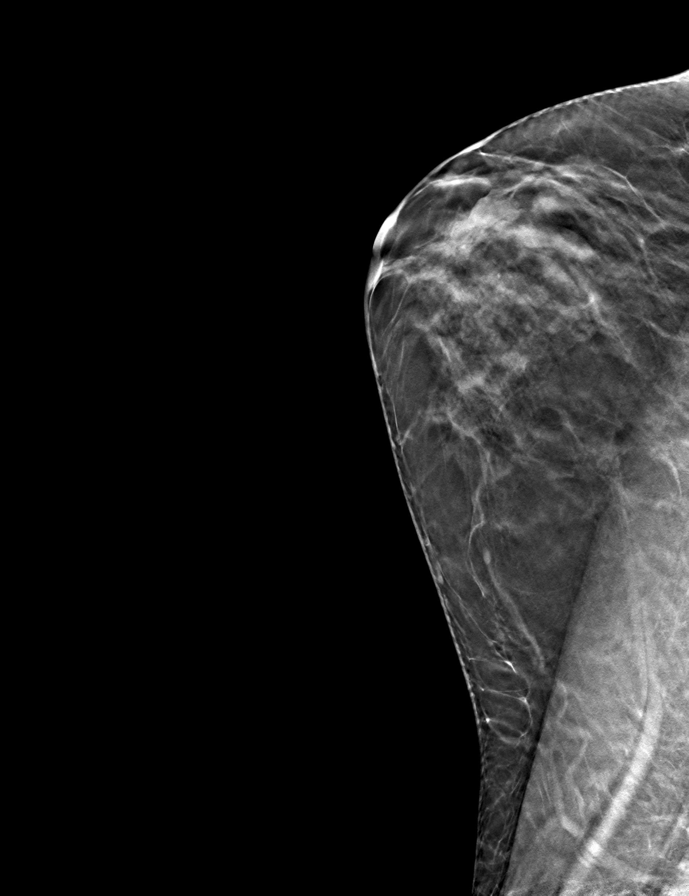
[frame 27/52]
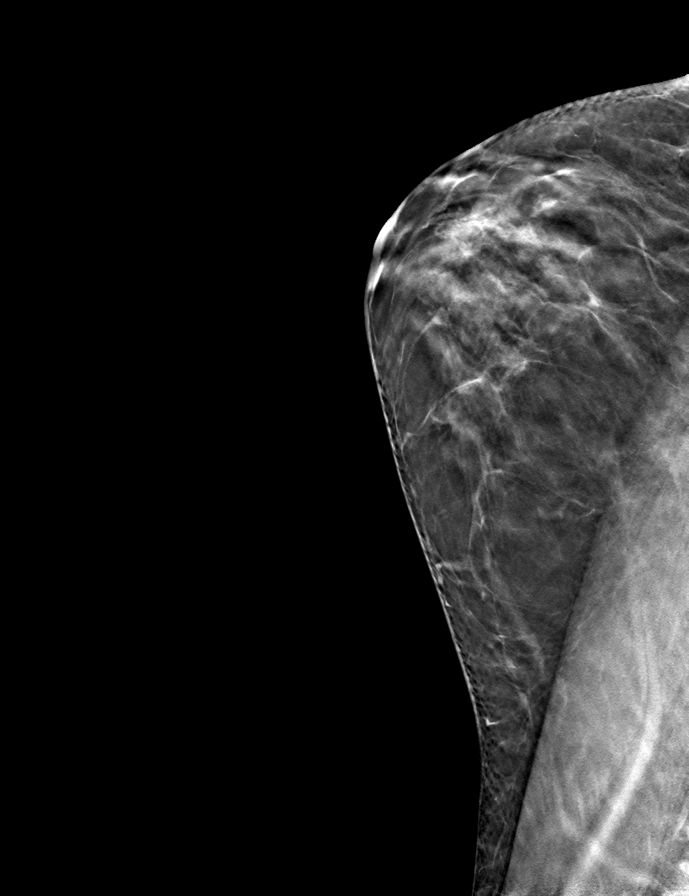

[R MLO tomo · tomo slice 27/52.0]
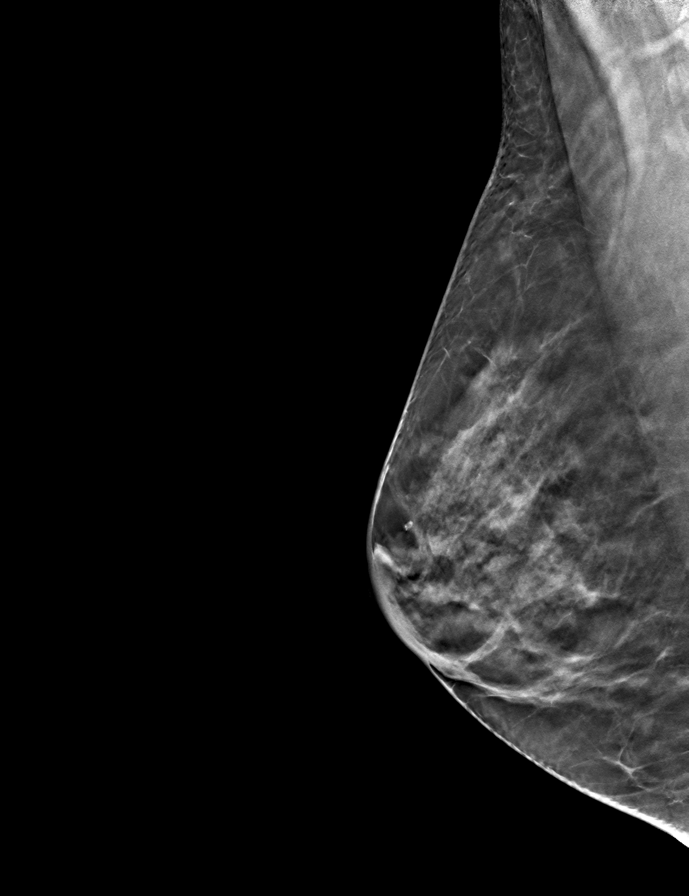

[L CC tomo · tomo slice 28/55.0]
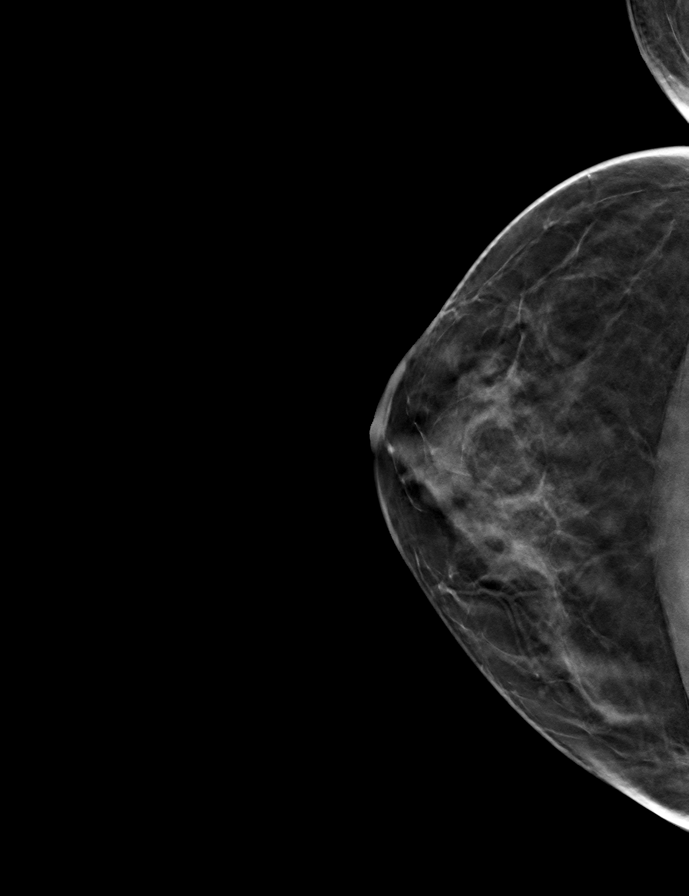

[R CC tomo · tomo slice 27/53.0]
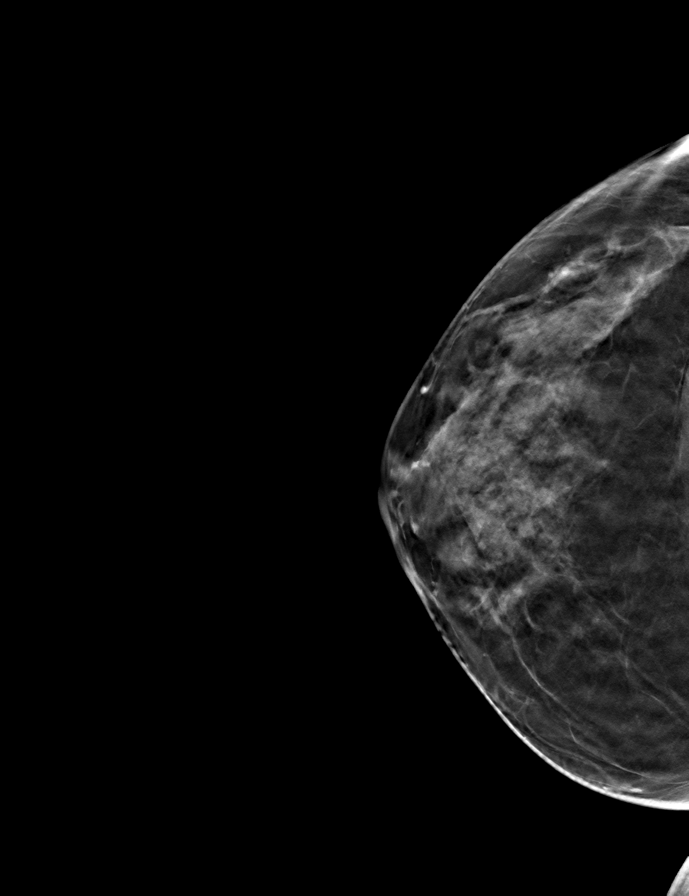

[9 of 24 positions shown; findings below may reference images not displayed]

ACR Breast Density Category c: The breast tissue is heterogeneously
dense, which may obscure small masses.
FINDINGS: There are no findings suspicious for malignancy. Images were
processed with CAD.
IMPRESSION: No mammographic evidence of malignancy. A result letter of this
screening mammogram will be mailed directly to the patient.

RECOMMENDATION:
Screening mammogram in one year. (Code:[id number]-[id number])

BI-RADS CATEGORY  1: Negative.

## 2019-03-24 NOTE — Progress Notes (Signed)
Subjective:   Donna Collins is a 58 y.o. No obstetric history on file. Caucasian female here for a routine well-woman exam.  No LMP recorded. Patient has had a hysterectomy.    Current complaints: sex is still very painful at the introitus, so has stopped having sex.  PCP: Doy Hutching       does desire labs  Social History: Sexual: heterosexual Marital Status: married Living situation: with spouse Occupation: [employer] Tobacco/alcohol: no tobacco use Illicit drugs: no history of illicit drug use  The following portions of the patient's history were reviewed and updated as appropriate: allergies, current medications, past family history, past medical history, past social history, past surgical history and problem list.  Past Medical History Past Medical History:  Diagnosis Date  . Endometriosis   . GERD (gastroesophageal reflux disease)   . Headache   . Neck pain   . Painful intercourse   . Painful menstrual periods     Past Surgical History Past Surgical History:  Procedure Laterality Date  . ABDOMINAL HYSTERECTOMY    . COLONOSCOPY W/ POLYPECTOMY  2009    Gynecologic History No obstetric history on file.  No LMP recorded. Patient has had a hysterectomy. Contraception: post menopausal status Last Pap: 2016. Results were: normal Last mammogram: 08/2018. Results were: normal   Obstetric History OB History  No obstetric history on file.    Current Medications Current Outpatient Medications on File Prior to Visit  Medication Sig Dispense Refill  . ALPRAZolam (XANAX) 0.25 MG tablet Take 1 tablet (0.25 mg total) by mouth at bedtime as needed for anxiety. 30 tablet 1  . Estradiol Micronized POWD 1 Applicatorful by Does not apply route at bedtime. 30 g 10  . venlafaxine XR (EFFEXOR-XR) 75 MG 24 hr capsule Take 75 mg by mouth daily with breakfast.    . progesterone (PROMETRIUM) 100 MG capsule Take 100 mg by mouth daily.     No current facility-administered medications on file  prior to visit.     Review of Systems Patient denies any headaches, blurred vision, shortness of breath, chest pain, abdominal pain, problems with bowel movements, urination, or intercourse.  Objective:  BP 131/84   Pulse 77   Ht 5\' 6"  (1.676 m)   Wt 139 lb 11.2 oz (63.4 kg)   BMI 22.55 kg/m  Physical Exam  General:  Well developed, well nourished, no acute distress. She is alert and oriented x3. Skin:  Warm and dry Neck:  Midline trachea, no thyromegaly or nodules Cardiovascular: Regular rate and rhythm, no murmur heard Lungs:  Effort normal, all lung fields clear to auscultation bilaterally Breasts:  No dominant palpable mass, retraction, or nipple discharge Abdomen:  Soft, non tender, no hepatosplenomegaly or masses Pelvic:  External genitalia is normal in appearance.  The vagina is normal in appearance. The cervix is bulbous, no CMT.  Thin prep pap is done with HR HPV cotesting. Uterus is felt to be normal size, shape, and contour.  No adnexal masses or tenderness noted. Extremities:  No swelling or varicosities noted Psych:  She has a normal mood and affect  Assessment:   Healthy well-woman exam  Plan:  Recommend continued estrogen use and trying Uberlube(sample given) Labs obtained- will send copy and follow up accordingly/ F/U 1 year for AE, or sooner if needed Mammogram ordered   Melody Rockney Ghee, CNM

## 2019-03-24 NOTE — Addendum Note (Signed)
Addended by: Keturah Barre L on: 03/24/2019 03:54 PM   Modules accepted: Orders

## 2019-03-27 LAB — COMPREHENSIVE METABOLIC PANEL
ALT: 15 IU/L (ref 0–32)
AST: 17 IU/L (ref 0–40)
Albumin/Globulin Ratio: 2.6 — ABNORMAL HIGH (ref 1.2–2.2)
Albumin: 4.4 g/dL (ref 3.8–4.9)
Alkaline Phosphatase: 33 IU/L — ABNORMAL LOW (ref 39–117)
BUN/Creatinine Ratio: 18 (ref 9–23)
BUN: 14 mg/dL (ref 6–24)
Bilirubin Total: 0.3 mg/dL (ref 0.0–1.2)
CO2: 26 mmol/L (ref 20–29)
Calcium: 9 mg/dL (ref 8.7–10.2)
Chloride: 104 mmol/L (ref 96–106)
Creatinine, Ser: 0.78 mg/dL (ref 0.57–1.00)
GFR calc Af Amer: 98 mL/min/{1.73_m2} (ref >59)
GFR calc non Af Amer: 85 mL/min/{1.73_m2} (ref >59)
Globulin, Total: 1.7 g/dL (ref 1.5–4.5)
Glucose: 92 mg/dL (ref 65–99)
Potassium: 4.3 mmol/L (ref 3.5–5.2)
Sodium: 140 mmol/L (ref 134–144)
Total Protein: 6.1 g/dL (ref 6.0–8.5)

## 2019-03-27 LAB — HEMOGLOBIN A1C
Est. average glucose Bld gHb Est-mCnc: 111 mg/dL
Hgb A1c MFr Bld: 5.5 % (ref 4.8–5.6)

## 2019-03-27 LAB — TESTOSTERONE, FREE, TOTAL, SHBG
Sex Hormone Binding: 127.7 nmol/L — ABNORMAL HIGH (ref 17.3–125.0)
Testosterone, Free: 0.7 pg/mL (ref 0.0–4.2)
Testosterone: <3 ng/dL — ABNORMAL LOW (ref 3–41)

## 2019-03-27 LAB — LIPID PANEL
Chol/HDL Ratio: 2 ratio (ref 0.0–4.4)
Cholesterol, Total: 177 mg/dL (ref 100–199)
HDL: 90 mg/dL (ref >39)
LDL Calculated: 74 mg/dL (ref 0–99)
Triglycerides: 67 mg/dL (ref 0–149)
VLDL Cholesterol Cal: 13 mg/dL (ref 5–40)

## 2019-03-27 LAB — ESTRADIOL: Estradiol: 37 pg/mL

## 2019-03-27 LAB — TSH: TSH: 1.9 u[IU]/mL (ref 0.450–4.500)

## 2019-03-27 LAB — DHEA-SULFATE: DHEA-SO4: 50.8 ug/dL (ref 29.4–220.5)

## 2019-03-27 LAB — PROGESTERONE: Progesterone: 0.1 ng/mL

## 2019-03-30 LAB — PAP IG W/ RFLX HPV ASCU

## 2019-08-24 ENCOUNTER — Telehealth: Payer: Self-pay

## 2019-08-24 NOTE — Telephone Encounter (Signed)
Pt requesting refill of estradiol. LV with [facility] 03/2018.   [facility] will refill until 04/2020. Informed pt that [facility] was no longer at the practice. Advised to r/s with another provider. She will do some research and call us back with who she will r/s with.

## 2019-10-26 ENCOUNTER — Other Ambulatory Visit: Payer: Self-pay

## 2019-10-26 DIAGNOSIS — R079 Chest pain, unspecified: Secondary | ICD-10-CM

## 2019-10-26 NOTE — Progress Notes (Signed)
Dr Ubaldo Glassing pt needs CT CA Score  DX Chest Pain R07.9

## 2019-10-28 ENCOUNTER — Ambulatory Visit (INDEPENDENT_AMBULATORY_CARE_PROVIDER_SITE_OTHER)
Admission: RE | Admit: 2019-10-28 | Discharge: 2019-10-28 | Disposition: A | Discharge status: Home / Self Care | Payer: Self-pay | Source: Ambulatory Visit | Attending: Cardiovascular Disease | Admitting: Cardiovascular Disease

## 2019-10-28 ENCOUNTER — Other Ambulatory Visit: Payer: Self-pay

## 2019-10-28 DIAGNOSIS — R079 Chest pain, unspecified: Secondary | ICD-10-CM

## 2019-11-11 ENCOUNTER — Other Ambulatory Visit: Payer: BLUE CROSS/BLUE SHIELD

## 2020-06-18 ENCOUNTER — Other Ambulatory Visit: Payer: Self-pay | Admitting: Internal Medicine

## 2020-06-18 DIAGNOSIS — Z1231 Encounter for screening mammogram for malignant neoplasm of breast: Secondary | ICD-10-CM

## 2020-06-27 ENCOUNTER — Other Ambulatory Visit: Payer: Self-pay

## 2020-06-27 ENCOUNTER — Ambulatory Visit
Admission: RE | Admit: 2020-06-27 | Discharge: 2020-06-27 | Disposition: A | Discharge status: Home / Self Care | Payer: BC Managed Care – PPO | Source: Ambulatory Visit | Attending: Internal Medicine | Admitting: Internal Medicine

## 2020-06-27 DIAGNOSIS — Z1231 Encounter for screening mammogram for malignant neoplasm of breast: Secondary | ICD-10-CM | POA: Diagnosis not present

## 2021-08-23 ENCOUNTER — Other Ambulatory Visit: Payer: Self-pay | Admitting: Internal Medicine

## 2021-08-23 DIAGNOSIS — I10 Essential (primary) hypertension: Secondary | ICD-10-CM

## 2021-08-23 DIAGNOSIS — R1010 Upper abdominal pain, unspecified: Secondary | ICD-10-CM

## 2021-08-27 ENCOUNTER — Ambulatory Visit
Admission: RE | Admit: 2021-08-27 | Discharge: 2021-08-27 | Disposition: A | Discharge status: Home / Self Care | Payer: BC Managed Care – PPO | Source: Ambulatory Visit | Attending: Internal Medicine | Admitting: Internal Medicine

## 2021-08-27 ENCOUNTER — Other Ambulatory Visit: Payer: Self-pay

## 2021-08-27 DIAGNOSIS — R1010 Upper abdominal pain, unspecified: Secondary | ICD-10-CM | POA: Diagnosis present

## 2021-08-27 DIAGNOSIS — I10 Essential (primary) hypertension: Secondary | ICD-10-CM | POA: Insufficient documentation

## 2021-09-17 ENCOUNTER — Other Ambulatory Visit: Payer: Self-pay | Admitting: General Surgery

## 2021-09-17 DIAGNOSIS — R1012 Left upper quadrant pain: Secondary | ICD-10-CM

## 2021-10-10 ENCOUNTER — Ambulatory Visit: Payer: BC Managed Care – PPO

## 2021-10-25 ENCOUNTER — Ambulatory Visit: Payer: Self-pay

## 2021-10-29 ENCOUNTER — Other Ambulatory Visit: Payer: Self-pay | Admitting: Internal Medicine

## 2021-10-29 DIAGNOSIS — Z1231 Encounter for screening mammogram for malignant neoplasm of breast: Secondary | ICD-10-CM

## 2021-12-10 ENCOUNTER — Other Ambulatory Visit: Payer: Self-pay

## 2021-12-10 ENCOUNTER — Ambulatory Visit (INDEPENDENT_AMBULATORY_CARE_PROVIDER_SITE_OTHER): Payer: Managed Care, Other (non HMO) | Admitting: Gastroenterology

## 2021-12-10 ENCOUNTER — Encounter: Payer: Self-pay | Admitting: Gastroenterology

## 2021-12-10 VITALS — BP 119/77 | HR 80 | Temp 98.5°F | Ht 66.0 in | Wt 154.4 lb

## 2021-12-10 DIAGNOSIS — S5290XA Unspecified fracture of unspecified forearm, initial encounter for closed fracture: Secondary | ICD-10-CM | POA: Insufficient documentation

## 2021-12-10 DIAGNOSIS — K219 Gastro-esophageal reflux disease without esophagitis: Secondary | ICD-10-CM | POA: Insufficient documentation

## 2021-12-10 DIAGNOSIS — N8000 Endometriosis of the uterus, unspecified: Secondary | ICD-10-CM | POA: Insufficient documentation

## 2021-12-10 DIAGNOSIS — J309 Allergic rhinitis, unspecified: Secondary | ICD-10-CM | POA: Insufficient documentation

## 2021-12-10 DIAGNOSIS — K5909 Other constipation: Secondary | ICD-10-CM

## 2021-12-10 DIAGNOSIS — K589 Irritable bowel syndrome without diarrhea: Secondary | ICD-10-CM | POA: Insufficient documentation

## 2021-12-10 NOTE — Progress Notes (Signed)
?  ?Donna Collins, Donna Collins ?[address]  ?[address]  ?[place], [place] [address]  ?Main: [phone]  ?Fax: [phone] ? ? ? ?Gastroenterology Consultation ? ?Referring Provider:     Idelle Collins, Donna Collins ?Primary Care Physician:  Donna Collins, Donna Collins ?Primary Gastroenterologist:  Dr. Cephas Collins ?Reason for Consultation:     Constipation ?      ? HPI:   ?Donna Collins is a 61 y.o. female referred by Dr. Doy Collins, Donna Collins, Donna Collins  for consultation & management of chronic constipation.  Patient reports that she has been suffering from irregular bowel habits, hard stool for last several weeks.  She reports that she had left-sided abdominal pain in fall that was self-limited episode.  She has irregular bowel habits, she goes about 2 to 5 days without having a BM.  She tries to stay active, she thinks her diet is devoid of fiber.  She finds her work to be stressful.  She does report abdominal bloating.  Her last colonoscopy was 3 and half years ago and it was normal.  She had at least 3 colonoscopies since the age of 69 and the index colonoscopy revealed polyp.  She denies any family history of GI malignancy ?She does not smoke or drink alcohol ? ?NSAIDs: None ? ?Antiplts/Anticoagulants/Anti thrombotics: None ? ?GI Procedures: Colonoscopy since age 56, polyp was detected at that time, subsequent 2 colonoscopies were unremarkable.  Last colonoscopy was 3 and half years ago, she is due for repeat colonoscopy in 2024 ? ?Past Medical History:  ?Diagnosis Date  ? Endometriosis   ? GERD (gastroesophageal reflux disease)   ? Headache   ? Neck pain   ? Painful intercourse   ? Painful menstrual periods   ? ? ?Past Surgical History:  ?Procedure Laterality Date  ? ABDOMINAL HYSTERECTOMY    ? COLONOSCOPY W/ POLYPECTOMY  2009  ? ?Current Outpatient Medications:  ?  co-enzyme Q-10 30 MG capsule, Take by mouth., Disp: , Rfl:  ?  Estradiol Micronized POWD, 1 Applicatorful by Does not apply route at bedtime., Disp: 30 g, Rfl: 10 ?   folic acid (FOLVITE) 1 MG tablet, Take by mouth., Disp: , Rfl:  ?  Lactobacillus Rhamnosus, GG, (RA PROBIOTIC DIGESTIVE CARE) CAPS, Take 1 capsule by mouth daily., Disp: , Rfl:  ?  Magnesium Oxide 400 MG CAPS, Take by mouth., Disp: , Rfl:  ?  venlafaxine XR (EFFEXOR-XR) 75 MG 24 hr capsule, Take 75 mg by mouth daily with breakfast., Disp: , Rfl:  ? ?Family History  ?Problem Relation Age of Onset  ? Breast cancer Neg Hx   ?  ? ?Social History  ? ?Tobacco Use  ? Smoking status: Never  ? Smokeless tobacco: Never  ?Vaping Use  ? Vaping Use: Never used  ?Substance Use Topics  ? Alcohol use: No  ? Drug use: No  ? ? ?Allergies as of 12/10/2021 - Review Complete 12/10/2021  ?Allergen Reaction Noted  ? Ciprofloxacin Nausea And Vomiting 12/10/2021  ? Macrobid [nitrofurantoin monohyd macro]  06/12/2015  ? Sulfa antibiotics  06/12/2015  ? ? ?Review of Systems:    ?All systems reviewed and negative except where noted in HPI. ? ? Physical Exam:  ?BP 119/77 (BP Location: Left Arm, Patient Position: Sitting, Cuff Size: Normal)   Pulse 80   Temp 98.5 ?F (36.9 ?C) (Oral)   Ht '5\' 6"'$  (1.676 m)   Wt 154 lb 6 oz (70 kg)   BMI 24.92 kg/m?  ?  No LMP recorded. Patient has had a hysterectomy. ? ?General:   Alert,  Well-developed, well-nourished, pleasant and cooperative in NAD ?Head:  Normocephalic and atraumatic. ?Eyes:  Sclera clear, no icterus.   Conjunctiva pink. ?Ears:  Normal auditory acuity. ?Nose:  No deformity, discharge, or lesions. ?Mouth:  No deformity or lesions,oropharynx pink & moist. ?Neck:  Supple; no masses or thyromegaly. ?Lungs:  Respirations even and unlabored.  Clear throughout to auscultation.   No wheezes, crackles, or rhonchi. No acute distress. ?Heart:  Regular rate and rhythm; no murmurs, clicks, rubs, or gallops. ?Abdomen:  Normal bowel sounds. Soft, non-tender and non-distended without masses, hepatosplenomegaly or hernias noted.  No guarding or rebound tenderness.   ?Rectal: Not performed ?Msk:  Symmetrical  without gross deformities. Good, equal movement & strength bilaterally. ?Pulses:  Normal pulses noted. ?Extremities:  No clubbing or edema.  No cyanosis. ?Neurologic:  Alert and oriented x3;  grossly normal neurologically. ?Skin:  Intact without significant lesions or rashes. No jaundice. ?Psych:  Alert and cooperative. Normal mood and affect. ? ?Imaging Studies: ?No cross-sectional imaging of the abdomen ? ?Assessment and Plan:  ? ?Donna Collins is a 61 y.o. pleasant female with no significant past medical history seen in consultation for chronic constipation ? ?Chronic constipation without any alarm signs or symptoms ?Discussed about high-fiber diet, information provided ?Discussed about adequate intake of water ?Fiber supplements such as fiber choice or Benefiber ?Trial of MiraLAX daily ? ? ?Follow up in 4 months ? ? ?Donna Collins, Donna Collins ? ?

## 2021-12-10 NOTE — Patient Instructions (Signed)
High-Fiber Eating Plan °Fiber, also called dietary fiber, is a type of carbohydrate. It is found foods such as fruits, vegetables, whole grains, and beans. A high-fiber diet can have many health benefits. Your health care provider may recommend a high-fiber diet to help: °Prevent constipation. Fiber can make your bowel movements more regular. °Lower your cholesterol. °Relieve the following conditions: °Inflammation of veins in the anus (hemorrhoids). °Inflammation of specific areas of the digestive tract (uncomplicated diverticulosis). °A problem of the large intestine, also called the colon, that sometimes causes pain and diarrhea (irritable bowel syndrome, or IBS). °Prevent overeating as part of a weight-loss plan. °Prevent heart disease, type 2 diabetes, and certain cancers. °What are tips for following this plan? °Reading food labels ° °Check the nutrition facts label on food products for the amount of dietary fiber. Choose foods that have 5 grams of fiber or more per serving. °The goals for recommended daily fiber intake include: °Men (age 50 or younger): 34-38 g. °Men (over age 50): 28-34 g. °Women (age 50 or younger): 25-28 g. °Women (over age 50): 22-25 g. °Your daily fiber goal is _____________ g. °Shopping °Choose whole fruits and vegetables instead of processed forms, such as apple juice or applesauce. °Choose a wide variety of high-fiber foods such as avocados, lentils, oats, and kidney beans. °Read the nutrition facts label of the foods you choose. Be aware of foods with added fiber. These foods often have high sugar and sodium amounts per serving. °Cooking °Use whole-grain flour for baking and cooking. °Cook with brown rice instead of white rice. °Meal planning °Start the day with a breakfast that is high in fiber, such as a cereal that contains 5 g of fiber or more per serving. °Eat breads and cereals that are made with whole-grain flour instead of refined flour or white flour. °Eat brown rice, bulgur  wheat, or millet instead of white rice. °Use beans in place of meat in soups, salads, and pasta dishes. °Be sure that half of the grains you eat each day are whole grains. °General information °You can get the recommended daily intake of dietary fiber by: °Eating a variety of fruits, vegetables, grains, nuts, and beans. °Taking a fiber supplement if you are not able to take in enough fiber in your diet. It is better to get fiber through food than from a supplement. °Gradually increase how much fiber you consume. If you increase your intake of dietary fiber too quickly, you may have bloating, cramping, or gas. °Drink plenty of water to help you digest fiber. °Choose high-fiber snacks, such as berries, raw vegetables, nuts, and popcorn. °What foods should I eat? °Fruits °Berries. Pears. Apples. Oranges. Avocado. Prunes and raisins. Dried figs. °Vegetables °Sweet potatoes. Spinach. Kale. Artichokes. Cabbage. Broccoli. Cauliflower. Green peas. Carrots. Squash. °Grains °Whole-grain breads. Multigrain cereal. Oats and oatmeal. Brown rice. Barley. Bulgur wheat. Millet. Quinoa. Bran muffins. Popcorn. Rye wafer crackers. °Meats and other proteins °Navy beans, kidney beans, and pinto beans. Soybeans. Split peas. Lentils. Nuts and seeds. °Dairy °Fiber-fortified yogurt. °Beverages °Fiber-fortified soy milk. Fiber-fortified orange juice. °Other foods °Fiber bars. °The items listed above may not be a complete list of recommended foods and beverages. Contact a dietitian for more information. °What foods should I avoid? °Fruits °Fruit juice. Cooked, strained fruit. °Vegetables °Fried potatoes. Canned vegetables. Well-cooked vegetables. °Grains °White bread. Pasta made with refined flour. White rice. °Meats and other proteins °Fatty cuts of meat. Fried chicken or fried fish. °Dairy °Milk. Yogurt. Cream cheese. Sour cream. °Fats and   oils °Butters. °Beverages °Soft drinks. °Other foods °Cakes and pastries. °The items listed above may  not be a complete list of foods and beverages to avoid. Talk with your dietitian about what choices are best for you. °Summary °Fiber is a type of carbohydrate. It is found in foods such as fruits, vegetables, whole grains, and beans. °A high-fiber diet has many benefits. It can help to prevent constipation, lower blood cholesterol, aid weight loss, and reduce your risk of heart disease, diabetes, and certain cancers. °Increase your intake of fiber gradually. Increasing fiber too quickly may cause cramping, bloating, and gas. Drink plenty of water while you increase the amount of fiber you consume. °The best sources of fiber include whole fruits and vegetables, whole grains, nuts, seeds, and beans. °This information is not intended to replace advice given to you by your health care provider. Make sure you discuss any questions you have with your health care provider. °Document Revised: 01/26/2020 Document Reviewed: 01/26/2020 °Elsevier Patient Education © 2022 [employer] ° °

## 2021-12-25 ENCOUNTER — Other Ambulatory Visit: Payer: Self-pay

## 2021-12-25 ENCOUNTER — Ambulatory Visit
Admission: RE | Admit: 2021-12-25 | Discharge: 2021-12-25 | Disposition: A | Discharge status: Home / Self Care | Payer: Managed Care, Other (non HMO) | Source: Ambulatory Visit | Attending: Internal Medicine | Admitting: Internal Medicine

## 2021-12-25 DIAGNOSIS — Z1231 Encounter for screening mammogram for malignant neoplasm of breast: Secondary | ICD-10-CM | POA: Insufficient documentation

## 2022-04-21 ENCOUNTER — Telehealth: Payer: Managed Care, Other (non HMO) | Admitting: Gastroenterology

## 2022-05-01 ENCOUNTER — Other Ambulatory Visit: Payer: Self-pay | Admitting: General Surgery

## 2022-05-05 LAB — SURGICAL PATHOLOGY

## 2023-04-29 ENCOUNTER — Other Ambulatory Visit: Payer: Self-pay | Admitting: Internal Medicine

## 2023-04-29 DIAGNOSIS — Z1231 Encounter for screening mammogram for malignant neoplasm of breast: Secondary | ICD-10-CM

## 2023-09-23 LAB — COLOGUARD: COLOGUARD: NEGATIVE

## 2023-09-23 LAB — EXTERNAL GENERIC LAB PROCEDURE: COLOGUARD: NEGATIVE

## 2024-01-17 ENCOUNTER — Emergency Department

## 2024-01-17 ENCOUNTER — Emergency Department: Admission: EM | Admit: 2024-01-17 | Discharge: 2024-01-17 | Disposition: A | Discharge status: Home / Self Care

## 2024-01-17 ENCOUNTER — Other Ambulatory Visit: Payer: Self-pay

## 2024-01-17 DIAGNOSIS — R1013 Epigastric pain: Secondary | ICD-10-CM | POA: Insufficient documentation

## 2024-01-17 DIAGNOSIS — B349 Viral infection, unspecified: Secondary | ICD-10-CM | POA: Diagnosis not present

## 2024-01-17 DIAGNOSIS — I10 Essential (primary) hypertension: Secondary | ICD-10-CM | POA: Insufficient documentation

## 2024-01-17 DIAGNOSIS — R079 Chest pain, unspecified: Secondary | ICD-10-CM | POA: Diagnosis not present

## 2024-01-17 DIAGNOSIS — R112 Nausea with vomiting, unspecified: Secondary | ICD-10-CM

## 2024-01-17 LAB — BASIC METABOLIC PANEL WITH GFR
Anion gap: 11 (ref 5–15)
BUN: 11 mg/dL (ref 8–23)
CO2: 22 mmol/L (ref 22–32)
Calcium: 9 mg/dL (ref 8.9–10.3)
Chloride: 102 mmol/L (ref 98–111)
Creatinine, Ser: 0.64 mg/dL (ref 0.44–1.00)
GFR, Estimated: >60 mL/min (ref >60)
Glucose, Bld: 119 mg/dL — ABNORMAL HIGH (ref 70–99)
Potassium: 4.4 mmol/L (ref 3.5–5.1)
Sodium: 135 mmol/L (ref 135–145)

## 2024-01-17 LAB — CBC
HCT: 42.6 % (ref 36.0–46.0)
Hemoglobin: 14.4 g/dL (ref 12.0–15.0)
MCH: 33.1 pg (ref 26.0–34.0)
MCHC: 33.8 g/dL (ref 30.0–36.0)
MCV: 97.9 fL (ref 80.0–100.0)
Platelets: 219 10*3/uL (ref 150–400)
RBC: 4.35 MIL/uL (ref 3.87–5.11)
RDW: 11.9 % (ref 11.5–15.5)
WBC: 8.7 10*3/uL (ref 4.0–10.5)
nRBC: 0 % (ref 0.0–0.2)

## 2024-01-17 LAB — HEPATIC FUNCTION PANEL
ALT: 17 U/L (ref 0–44)
AST: 22 U/L (ref 15–41)
Albumin: 4.2 g/dL (ref 3.5–5.0)
Alkaline Phosphatase: 38 U/L (ref 38–126)
Bilirubin, Direct: 0.2 mg/dL (ref 0.0–0.2)
Indirect Bilirubin: 0.7 mg/dL (ref 0.3–0.9)
Total Bilirubin: 0.9 mg/dL (ref 0.0–1.2)
Total Protein: 6.8 g/dL (ref 6.5–8.1)

## 2024-01-17 LAB — TROPONIN I (HIGH SENSITIVITY)
Troponin I (High Sensitivity): <2 ng/L (ref <18)
Troponin I (High Sensitivity): 3 ng/L (ref <18)

## 2024-01-17 LAB — LIPASE, BLOOD: Lipase: 25 U/L (ref 11–51)

## 2024-01-17 MED ORDER — OMEPRAZOLE MAGNESIUM 20 MG PO TBEC: 20.0000 mg | DELAYED_RELEASE_TABLET | Freq: Every day | ORAL | 0 refills | Status: DC

## 2024-01-17 MED ORDER — ACETAMINOPHEN 500 MG PO TABS
1000.0000 mg | ORAL_TABLET | Freq: Once | ORAL | Status: AC
  Administered 2024-01-17: 1000 mg via ORAL
  Filled 2024-01-17: qty 2

## 2024-01-17 MED ORDER — OMEPRAZOLE MAGNESIUM 20 MG PO TBEC: 20.0000 mg | DELAYED_RELEASE_TABLET | Freq: Every day | ORAL | 0 refills | Status: AC

## 2024-01-17 MED ORDER — FAMOTIDINE 20 MG PO TABS
20.0000 mg | ORAL_TABLET | Freq: Once | ORAL | Status: AC
  Administered 2024-01-17: 20 mg via ORAL
  Filled 2024-01-17: qty 1

## 2024-01-17 MED ORDER — ONDANSETRON 4 MG PO TBDP: 4.0000 mg | ORAL_TABLET | Freq: Three times a day (TID) | ORAL | 0 refills | Status: DC | PRN

## 2024-01-17 MED ORDER — ONDANSETRON 4 MG PO TBDP: 4.0000 mg | ORAL_TABLET | Freq: Three times a day (TID) | ORAL | 0 refills | Status: AC | PRN

## 2024-01-17 MED ORDER — ALUM & MAG HYDROXIDE-SIMETH 200-200-20 MG/5ML PO SUSP
30.0000 mL | Freq: Once | ORAL | Status: AC
  Administered 2024-01-17: 30 mL via ORAL
  Filled 2024-01-17: qty 30

## 2024-01-17 MED ORDER — LIDOCAINE VISCOUS HCL 2 % MT SOLN
15.0000 mL | Freq: Once | OROMUCOSAL | Status: AC
  Administered 2024-01-17: 15 mL via ORAL
  Filled 2024-01-17: qty 15

## 2024-01-17 NOTE — ED Provider Notes (Signed)
 [facility] Provider Note    Event Date/Time   First MD Initiated Contact with Patient 01/17/24 1624     (approximate)   History   Chest Pain  Pt sts that she has been having chest pain that started this afternoon. Pt sts that she has been fighting a resp virus.   HPI Donna Collins is a 63 y.o. female hypertension, GERD, anxiety presents for evaluation of chest pain -Around 1230 this afternoon, intermittent episodes over several minutes.  Sharp pain in her epigastric region, lasting about 1 second at a time.  Followed by an episode of vomiting, nonbloody/nonbilious.  No diarrhea.  No frank abdominal pain. -Has had viral symptoms of cough, congestion over the past 3 days or so.  Husband had similar symptoms in his been ongoing for about 5 days.  Frank fevers. -Has not been having any exertional chest pain or anginal symptoms -No recent surgery/sedation/travel, no history of DVT/PE, no leg swelling, no hormone use      Physical Exam   Triage Vital Signs: ED Triage Vitals [01/17/24 1556]  Encounter Vitals Group     BP (!) 157/98     Systolic BP Percentile      Diastolic BP Percentile      Pulse Rate 96     Resp 18     Temp 99.5 F (37.5 C)     Temp Source Oral     SpO2 100 %     Weight 150 lb (68 kg)     Height 5\' 7"  (1.702 m)     Head Circumference      Peak Flow      Pain Score 2     Pain Loc      Pain Education      Exclude from Growth Chart     Most recent vital signs: Vitals:   01/17/24 1556  BP: (!) 157/98  Pulse: 96  Resp: 18  Temp: 99.5 F (37.5 C)  SpO2: 100%     General: Awake, no distress.  CV:  Good peripheral perfusion. RRR, RP 2+ Resp:  Normal effort. CTAB Abd:  No distention. Nontender to deep palpation throughout    ED Results / Procedures / Treatments   Labs (all labs ordered are listed, but only abnormal results are displayed) Labs Reviewed  BASIC METABOLIC PANEL WITH GFR - Abnormal; Notable for the  following components:      Result Value   Glucose, Bld 119 (*)    All other components within normal limits  CBC  HEPATIC FUNCTION PANEL  LIPASE, BLOOD  TROPONIN I (HIGH SENSITIVITY)  TROPONIN I (HIGH SENSITIVITY)     EKG  Ecg = sinus rhythm, rate 88, no ST elevation/depression, no acute repolarization abnormality, normal axis, normal intervals.  No evidence of ischemia my read.   RADIOLOGY Chest x-ray with no focal consolidation on my interpretation, no pneumothorax, no widened mediastinum, no other pathology appreciated  Radiology report reviewed  PROCEDURES:  Critical Care performed: No  Procedures   MEDICATIONS ORDERED IN ED: Medications  alum & mag hydroxide-simeth (MAALOX/MYLANTA) 200-200-20 MG/5ML suspension 30 mL (30 mLs Oral Given 01/17/24 1656)    And  lidocaine (XYLOCAINE) 2 % viscous mouth solution 15 mL (15 mLs Oral Given 01/17/24 1656)  famotidine (PEPCID) tablet 20 mg (20 mg Oral Given 01/17/24 1652)  acetaminophen (TYLENOL) tablet 1,000 mg (1,000 mg Oral Given 01/17/24 1653)     IMPRESSION / MDM / ASSESSMENT AND PLAN / ED  COURSE  I reviewed the triage vital signs and the nursing notes.                              DDX/MDM/AP: Differential diagnosis includes, but is not limited to, pain related to nausea and impending vomitus /gastritis the setting of recent viral illness, highly doubt acute intra-abdominal pathology including cholecystitis, pancreatitis.  Consider possibility of superimposed pneumonia.  Symptoms very atypical for ACS though will screen broadly.  Doubt pneumothorax or aortic dissection.  Plan: -EKG -Labs -GI cocktail -Patient declines viral testing -Reassess  Patient's presentation is most consistent with acute presentation with potential threat to life or bodily function.  The patient is on the cardiac monitor to evaluate for evidence of arrhythmia and/or significant heart rate changes.  ED course below.  Remarkable, EKG  nonischemic, troponins low and stable.  Presentation overall very atypical, suspect likely gastric cramping prior to episode of vomitus.  Score 2 for age and family history, no comorbidities on my eval, very atypical symptoms--patient is comfortable following up with PMD, can consider outpatient stress testing if recurrent symptoms.  Strict ED return precautions in place.  Rx Zofran and omeprazole.  No concern for acute intra-abdominal pathology with serial abdominal exams with no tenderness to palpation throughout.  Plan for PMD follow-up.  Patient agrees with plan.  Clinical Course as of 01/17/24 1913  Sun Jan 17, 2024  1711 CBC, BMP reviewed, unremarkable.  Hepatobiliary labs normal.  Initial troponin normal. [MM]  1711 Chest x-ray reviewed, unremarkable my interpretation.  Radiology report reviewed. [MM]  1745 Patient reevaluated, all chest discomfort has resolved status post GI cocktail, no recurrence of symptoms.  Remains with stable vital signs.  Reassured by unremarkable workup so far, delta troponin pending. [MM]    Clinical Course User Index [MM] Collis Deaner, MD     FINAL CLINICAL IMPRESSION(S) / ED DIAGNOSES   Final diagnoses:  Epigastric pain  Nonspecific chest pain  Nausea and vomiting, unspecified vomiting type  Viral syndrome     Rx / DC Orders   ED Discharge Orders          Ordered    omeprazole (PRILOSEC OTC) 20 MG tablet  Daily,   Status:  Discontinued        01/17/24 1854    ondansetron (ZOFRAN-ODT) 4 MG disintegrating tablet  Every 8 hours PRN,   Status:  Discontinued        01/17/24 1854    omeprazole (PRILOSEC OTC) 20 MG tablet  Daily        01/17/24 1912    ondansetron (ZOFRAN-ODT) 4 MG disintegrating tablet  Every 8 hours PRN        01/17/24 1912             Note:  This document was prepared using Dragon voice recognition software and may include unintentional dictation errors.   Collis Deaner, MD 01/17/24 [phone]

## 2024-01-17 NOTE — Discharge Instructions (Signed)
 Your evaluation in the emergency department was overall reassuring.  I suspect your upper abdominal/lower chest pain was likely due to irritation of your stomach related to your vomiting.  I prescribed you an antacid as well as a nausea medication to use over the next few days.  Please do follow-up with your primary care doctor for reevaluation, and return to the emergency department with any new or worsening symptoms.

## 2024-01-17 NOTE — ED Triage Notes (Signed)
 Pt sts that she has been having chest pain that started this afternoon. Pt sts that she has been fighting a resp virus.

## 2024-05-26 ENCOUNTER — Other Ambulatory Visit: Payer: Self-pay | Admitting: Internal Medicine

## 2024-05-26 DIAGNOSIS — Z1231 Encounter for screening mammogram for malignant neoplasm of breast: Secondary | ICD-10-CM

## 2024-05-27 ENCOUNTER — Other Ambulatory Visit: Payer: Self-pay | Admitting: Internal Medicine

## 2024-05-27 DIAGNOSIS — R634 Abnormal weight loss: Secondary | ICD-10-CM

## 2024-06-16 ENCOUNTER — Other Ambulatory Visit

## 2024-06-21 ENCOUNTER — Other Ambulatory Visit

## 2024-06-29 ENCOUNTER — Ambulatory Visit
Admission: RE | Admit: 2024-06-29 | Discharge: 2024-06-29 | Disposition: A | Discharge status: Home / Self Care | Source: Ambulatory Visit | Attending: Internal Medicine | Admitting: Internal Medicine

## 2024-06-29 DIAGNOSIS — R634 Abnormal weight loss: Secondary | ICD-10-CM

## 2024-07-04 ENCOUNTER — Ambulatory Visit
Admission: RE | Admit: 2024-07-04 | Discharge: 2024-07-04 | Disposition: A | Discharge status: Home / Self Care | Source: Ambulatory Visit | Attending: Internal Medicine | Admitting: Internal Medicine

## 2024-07-04 DIAGNOSIS — Z1231 Encounter for screening mammogram for malignant neoplasm of breast: Secondary | ICD-10-CM | POA: Diagnosis present
